# Patient Record
Sex: Female | Born: 1975 | Race: White | Hispanic: No | Marital: Married | Smoking: Never smoker
Health system: Southern US, Community
[De-identification: ages and names within clinical notes are randomized; demographics above are authoritative.]

## PROBLEM LIST (undated history)

## (undated) DIAGNOSIS — R7611 Nonspecific reaction to tuberculin skin test without active tuberculosis: Secondary | ICD-10-CM

## (undated) DIAGNOSIS — I10 Essential (primary) hypertension: Secondary | ICD-10-CM

## (undated) DIAGNOSIS — R112 Nausea with vomiting, unspecified: Secondary | ICD-10-CM

## (undated) DIAGNOSIS — T8859XA Other complications of anesthesia, initial encounter: Secondary | ICD-10-CM

## (undated) DIAGNOSIS — K219 Gastro-esophageal reflux disease without esophagitis: Secondary | ICD-10-CM

## (undated) DIAGNOSIS — T4145XA Adverse effect of unspecified anesthetic, initial encounter: Secondary | ICD-10-CM

## (undated) DIAGNOSIS — E669 Obesity, unspecified: Secondary | ICD-10-CM

## (undated) DIAGNOSIS — R002 Palpitations: Secondary | ICD-10-CM

## (undated) DIAGNOSIS — R519 Headache, unspecified: Secondary | ICD-10-CM

## (undated) DIAGNOSIS — E282 Polycystic ovarian syndrome: Secondary | ICD-10-CM

## (undated) DIAGNOSIS — D649 Anemia, unspecified: Secondary | ICD-10-CM

## (undated) DIAGNOSIS — L509 Urticaria, unspecified: Secondary | ICD-10-CM

## (undated) DIAGNOSIS — Z9889 Other specified postprocedural states: Secondary | ICD-10-CM

## (undated) DIAGNOSIS — R51 Headache: Secondary | ICD-10-CM

## (undated) DIAGNOSIS — M549 Dorsalgia, unspecified: Secondary | ICD-10-CM

## (undated) HISTORY — DX: Urticaria, unspecified: L50.9

## (undated) HISTORY — PX: WISDOM TOOTH EXTRACTION: SHX21

## (undated) HISTORY — DX: Obesity, unspecified: E66.9

---

## 2011-06-29 ENCOUNTER — Ambulatory Visit
Admission: RE | Admit: 2011-06-29 | Discharge: 2011-06-29 | Disposition: A | Discharge status: Home / Self Care | Payer: PRIVATE HEALTH INSURANCE | Source: Ambulatory Visit | Attending: Infectious Diseases | Admitting: Infectious Diseases

## 2011-06-29 ENCOUNTER — Other Ambulatory Visit: Payer: Self-pay | Admitting: Infectious Diseases

## 2011-06-29 DIAGNOSIS — R7611 Nonspecific reaction to tuberculin skin test without active tuberculosis: Secondary | ICD-10-CM

## 2011-10-11 ENCOUNTER — Encounter: Payer: 59 | Attending: Family Medicine | Admitting: *Deleted

## 2011-10-12 ENCOUNTER — Encounter: Payer: Self-pay | Admitting: *Deleted

## 2011-10-12 NOTE — Patient Instructions (Signed)
Patient will attend Core Diabetes Courses II and III as scheduled or follow up prn.  

## 2011-10-12 NOTE — Progress Notes (Signed)
No results found for this basename: HGBA1C   Most recent A1C per referring agent = 6.1%

## 2011-10-12 NOTE — Progress Notes (Signed)
  Patient was seen on 10/11/11 for the first of a series of three diabetes self-management courses at the [facility]. The following learning objectives were met by the patient during this course:   Defines the role of glucose and insulin  Identifies type of diabetes and pathophysiology  Defines the diagnostic criteria for diabetes and prediabetes  States the risk factors for Type 2 Diabetes  States the symptoms of Type 2 Diabetes  Defines Type 2 Diabetes treatment goals  Defines Type 2 Diabetes treatment options  States the rationale for glucose monitoring  Identifies A1C, glucose targets, and testing times  Identifies proper sharps disposal  Defines the purpose of a diabetes food plan  Identifies carbohydrate food groups  Defines effects of carbohydrate foods on glucose levels  Identifies carbohydrate choices/grams/food labels  States benefits of physical activity and effect on glucose  Review of suggested activity guidelines  Handouts given during class include:  Type 2 Diabetes: Basics Book  My Food Plan Book  Food and Activity Log  Patient has established the following initial goals:  Increase exercise  Work on stress levels  Follow DM Meal Plan  Lose Weight  Follow-Up Plan: Pt to attend Core Diabetes Education Class Series @ [facility]

## 2011-10-19 ENCOUNTER — Other Ambulatory Visit (HOSPITAL_COMMUNITY): Payer: Self-pay | Admitting: *Deleted

## 2011-10-19 DIAGNOSIS — Q519 Congenital malformation of uterus and cervix, unspecified: Secondary | ICD-10-CM

## 2011-10-21 ENCOUNTER — Ambulatory Visit (HOSPITAL_COMMUNITY)
Admission: RE | Admit: 2011-10-21 | Discharge: 2011-10-21 | Disposition: A | Discharge status: Home / Self Care | Payer: 59 | Source: Ambulatory Visit | Attending: Family Medicine | Admitting: Family Medicine

## 2011-10-21 DIAGNOSIS — Q519 Congenital malformation of uterus and cervix, unspecified: Secondary | ICD-10-CM

## 2011-11-16 ENCOUNTER — Ambulatory Visit: Payer: 59

## 2011-11-23 ENCOUNTER — Ambulatory Visit: Payer: 59

## 2011-12-14 ENCOUNTER — Encounter: Payer: 59 | Attending: Family Medicine | Admitting: Dietician

## 2011-12-15 NOTE — Progress Notes (Signed)
  Patient was seen on 12/14/2011 for the second of a series of three diabetes self-management courses at the [facility]. The following learning objectives were met by the patient during this course:   Explain basic nutrition maintenance and quality assurance  Describe causes, symptoms and treatment of hypoglycemia and hyperglycemia  Explain how to manage diabetes during illness  Describe the importance of good nutrition for health and healthy eating strategies  List strategies to follow meal plan when dining out  Describe the effects of alcohol on glucose and how to use it safely  Describe problem solving skills for day-to-day glucose challenges  Describe strategies to use when treatment plan needs to change  Identify important factors involved in successful weight loss  Describe ways to remain physically active  Describe the impact of regular activity on insulin resistance  Patient updated their initials goals from Core Class I to include:Agreed to contemplate her goals and finalize at Core Class 3.  ReliOnGlucose tablets given in class (1 packet)  Discussed the use of glucose tablets, but did not distribute a sample.  Handouts given in class:  Refrigerator magnet for Sick Day Guidelines  [facility] Oral medication/insulin handout  Follow-Up Plan: Patient will attend the final class of the ADA Diabetes Self-Care Education.

## 2011-12-21 ENCOUNTER — Encounter: Payer: 59 | Admitting: Dietician

## 2011-12-22 NOTE — Progress Notes (Signed)
  Patient was seen on 12/21/2011 for the third of a series of three diabetes self-management courses at the [facility]. The following learning objectives were met by the patient during this course:    Describe how diabetes changes over time   Identify diabetes complications and ways to prevent them   Describe strategies that can promote heart health including lowering blood pressure and cholesterol   Describe strategies to lower dietary fat and sodium in the diet   Identify physical activities that benefit cardiovascular health   Evaluate success in meeting personal goal   Describe the belief that they can live successfully with diabetes day to day   Establish 2-3 goals that they will plan to diligently work on until they return for the free 23-month follow-up visit  The following handouts were given in class:  3 Month Follow Up Visit handout  Goal setting handout  Class evaluation form  Your patient has established the following 3 month goals for diabetes self-care:  Eat less fast food and eating out   Be active 30 minutes or more 3 times per week.  Follow-Up Plan: Patient will attend a 3 month follow-up visit for diabetes self-management education.

## 2012-03-28 ENCOUNTER — Ambulatory Visit: Payer: 59 | Admitting: Dietician

## 2012-03-30 ENCOUNTER — Encounter: Payer: Self-pay | Admitting: Dietician

## 2012-03-30 ENCOUNTER — Encounter: Payer: 59 | Attending: Family Medicine | Admitting: Dietician

## 2012-03-30 DIAGNOSIS — Z713 Dietary counseling and surveillance: Secondary | ICD-10-CM | POA: Insufficient documentation

## 2012-03-30 DIAGNOSIS — E119 Type 2 diabetes mellitus without complications: Secondary | ICD-10-CM | POA: Insufficient documentation

## 2012-03-30 NOTE — Progress Notes (Signed)
  Patient was seen on 03/30/2012 for their 3 month follow-up as a part of the diabetes self-management courses at the [facility]. The following learning objectives were met by your patient during this course:  Patient self reports the following:  Diabetes control has improved since diabetes self-management training: yes Number of days blood glucose is >200: none Last MD appointment for diabetes: March 23, 2012 Changes in treatment plan: Metformin decreased to 500 mg two times daily Confidence with ability to manage diabetes: Feels more confident Areas for improvement with diabetes self-care: Wants to lose more weight, to lose to under 200 lbs.  Wants to continue to exercise and be able to run a 5k race. 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  Please see Diabetes Flow sheet for findings related to patient's self-care.  Follow-Up Plan: Patient is eligible for a "free" 30 minute diabetes self-care appointment in the next year. Patient to call and schedule as needed.

## 2012-11-30 ENCOUNTER — Ambulatory Visit (INDEPENDENT_AMBULATORY_CARE_PROVIDER_SITE_OTHER): Payer: Self-pay | Admitting: Family Medicine

## 2012-11-30 DIAGNOSIS — E119 Type 2 diabetes mellitus without complications: Secondary | ICD-10-CM

## 2012-11-30 NOTE — Progress Notes (Signed)
Patient presents for 3 month follow of DM as part of the Link to [employer] employee sponsored program. Medications and glucose readings have been reviewed. I have also discussed with patient lifestyle interventions such as diet and exercise. Full documentation of this visit can be found in the [employer] documenting system through [employer] [facility]). Patient has set a series of personal goals and will follow up in 3 months for further review of DM.

## 2012-12-25 NOTE — Progress Notes (Signed)
Patient ID: Angelica Carroll, female   DOB: 10/28/1975, 37 y.o.   MRN: [id number] ATTENDING PHYSICIAN NOTE: I have reviewed the chart and agree with the plan as detailed above. Denny Levy MD Pager [phone]

## 2013-04-12 ENCOUNTER — Ambulatory Visit (INDEPENDENT_AMBULATORY_CARE_PROVIDER_SITE_OTHER): Payer: Self-pay | Admitting: Family Medicine

## 2013-04-12 DIAGNOSIS — E119 Type 2 diabetes mellitus without complications: Secondary | ICD-10-CM

## 2013-04-12 NOTE — Progress Notes (Signed)
Patient presents for 3 month follow up DM as part of the employee sponsored Link to [employer]. Medications and glucose readings have been reviewed. I have also discussed with patient lifestyle interventions such as diet and exercise. Full documentation of this note can be found in the [employer] documenting system through [employer] [facility]). Patient has set a series of personal goals and will f/u in 3 months for further review of DM.

## 2013-04-24 NOTE — Progress Notes (Signed)
Patient ID: Angelica Carroll, female   DOB: 27-Jul-1976, 36 y.o.   MRN: [id number] ATTENDING PHYSICIAN NOTE: I have reviewed the chart and agree with the plan as detailed above. Denny Levy MD Pager [phone]

## 2013-06-19 ENCOUNTER — Encounter (HOSPITAL_COMMUNITY): Payer: Self-pay | Admitting: *Deleted

## 2013-06-19 ENCOUNTER — Emergency Department (HOSPITAL_COMMUNITY)
Admission: EM | Admit: 2013-06-19 | Discharge: 2013-06-19 | Disposition: A | Discharge status: Home / Self Care | Payer: 59 | Source: Home / Self Care | Attending: Emergency Medicine | Admitting: Emergency Medicine

## 2013-06-19 DIAGNOSIS — J029 Acute pharyngitis, unspecified: Secondary | ICD-10-CM

## 2013-06-19 MED ORDER — HYDROCOD POLST-CHLORPHEN POLST 10-8 MG/5ML PO LQCR: 5.0000 mL | Freq: Two times a day (BID) | ORAL | Status: DC | PRN

## 2013-06-19 MED ORDER — PREDNISONE 20 MG PO TABS: 20.0000 mg | ORAL_TABLET | Freq: Two times a day (BID) | ORAL | Status: DC

## 2013-06-19 NOTE — ED Notes (Signed)
Pt  Reports  Symptoms  Of  sorethroat   X  sev  Days  Worse  Yesterday    -  Hurts  To  Swallow  And  Has  Bilateral  Earache  As  Well             Pt  Reports    Some  Congestion and  For the  Most part  A  Non  Productive  Cough

## 2013-06-19 NOTE — ED Provider Notes (Signed)
Chief Complaint:   Chief Complaint  Patient presents with  . Sore Throat    History of Present Illness:   Angelica Carroll is a 37 year old female who has had a three-day history of a stroke, hoarseness, cough productive of small amounts of yellow sputum, chest tightness, chest pain, nasal congestion with clear drainage, sinus pressure, headache, watery eyes, and ear congestion. She was exposed to a viral illness. She denies any fever, chills, or GI symptoms.  Review of Systems:  Other than noted above, the patient denies any of the following symptoms: Systemic:  No fevers, chills, sweats, weight loss or gain, fatigue, or tiredness. Eye:  No redness or discharge. ENT:  No ear pain, drainage, headache, nasal congestion, drainage, sinus pressure, difficulty swallowing, or sore throat. Neck:  No neck pain or swollen glands. Lungs:  No cough, sputum production, hemoptysis, wheezing, chest tightness, shortness of breath or chest pain. GI:  No abdominal pain, nausea, vomiting or diarrhea.  PMFSH:  Past medical history, family history, social history, meds, and allergies were reviewed. She has diabetes and takes metformin and Protonix.  Physical Exam:   Vital signs:  BP 142/93  Pulse 82  Temp(Src) 99.5 F (37.5 C) (Oral)  Resp 16  SpO2 98% General:  Alert and oriented.  In no distress.  Skin warm and dry. Eye:  No conjunctival injection or drainage. Lids were normal. ENT:  TMs and canals were normal, without erythema or inflammation.  Nasal mucosa was clear and uncongested, without drainage.  Mucous membranes were moist.  Pharynx was clear with no exudate or drainage.  There were no oral ulcerations or lesions. Neck:  Supple, no adenopathy, tenderness or mass. Lungs:  No respiratory distress.  Lungs were clear to auscultation, without wheezes, rales or rhonchi.  Breath sounds were clear and equal bilaterally.  Heart:  Regular rhythm, without gallops, murmers or rubs. Skin:  Clear, warm, and dry,  without rash or lesions.  Labs:   Results for orders placed during the hospital encounter of 06/19/13  POCT RAPID STREP A ([facility] CARE ONLY)      Result Value Range   Streptococcus, Group A Screen (Direct) NEGATIVE  NEGATIVE     Assessment:  The encounter diagnosis was Viral pharyngitis.  Plan:   1.  The following meds were prescribed:   Discharge Medication List as of 06/19/2013  1:30 PM    START taking these medications   Details  chlorpheniramine-HYDROcodone (TUSSIONEX) 10-8 MG/5ML LQCR Take 5 mLs by mouth every 12 (twelve) hours as needed., Starting 06/19/2013, Until Discontinued, Normal    predniSONE (DELTASONE) 20 MG tablet Take 1 tablet (20 mg total) by mouth 2 (two) times daily., Starting 06/19/2013, Until Discontinued, Normal       2.  The patient was instructed in symptomatic care and handouts were given. 3.  The patient was told to return if becoming worse in any way, if no better in 3 or 4 days, and given some red flag symptoms such as fever or difficulty breathing that would indicate earlier return. 4.  Follow up here if necessary.      Reuben Likes, MD 06/19/13 [id number]

## 2013-06-21 ENCOUNTER — Telehealth (HOSPITAL_COMMUNITY): Payer: Self-pay | Admitting: *Deleted

## 2013-06-21 ENCOUNTER — Telehealth (HOSPITAL_COMMUNITY): Payer: Self-pay | Admitting: Emergency Medicine

## 2013-06-21 LAB — CULTURE, GROUP A STREP

## 2013-06-21 MED ORDER — AZITHROMYCIN 500 MG PO TABS: 500.0000 mg | ORAL_TABLET | Freq: Every day | ORAL | Status: DC

## 2013-06-21 NOTE — ED Notes (Signed)
Throat culture: Strep beta hemolytic not group A.  Message sent to Dr. Lorenz Coaster. Vassie Moselle 06/21/2013

## 2013-06-21 NOTE — ED Notes (Addendum)
Strep culture was positive for beta-hemolytic strep. She was not treated for streptococcus. We'll send in a prescription for penicillin V 500 mg 3 times a day for 10 days.  Reuben Likes, MD 06/21/13 1930  The patient is allergic to amoxicillin, so will treat with azithromycin 500 mg once a day for 5 days.  Reuben Likes, MD 06/21/13 [phone]

## 2013-06-21 NOTE — ED Notes (Signed)
Dr. Lorenz Coaster e-prescribed Zithromax to the [facility].  I called pt.  Pt. verified x 2 and given results. Pt. told she needs Zithromax for the strep throat.  Pt. told where to pick up her Rx.  Instructed pt. if anyone she exposed prior to treatment gets the same symptoms, that they should be checked for strep also.  Pt. voiced understanding. Vassie Moselle 06/21/2013

## 2013-06-21 NOTE — Telephone Encounter (Signed)
Message copied by Reuben Likes on Thu Jun 21, 2013  7:32 PM ------      Message from: Vassie Moselle      Created: Thu Jun 21, 2013  7:26 PM      Regarding: throat cx.       Strep beta hemolytic not group A. I did not see any PCN ordered.      Vassie Moselle      06/21/2013       ------

## 2013-06-21 NOTE — Telephone Encounter (Signed)
Message copied by Reuben Likes on Thu Jun 21, 2013  7:29 PM ------      Message from: Vassie Moselle      Created: Thu Jun 21, 2013  7:26 PM      Regarding: throat cx.       Strep beta hemolytic not group A. I did not see any PCN ordered.      Vassie Moselle      06/21/2013       ------

## 2013-08-02 ENCOUNTER — Ambulatory Visit (INDEPENDENT_AMBULATORY_CARE_PROVIDER_SITE_OTHER): Payer: 59 | Admitting: Family Medicine

## 2013-08-02 VITALS — BP 142/88 | HR 71

## 2013-08-02 DIAGNOSIS — E119 Type 2 diabetes mellitus without complications: Secondary | ICD-10-CM

## 2013-08-02 NOTE — Progress Notes (Signed)
Patient presents for 3 mo f/u DM as part of the employee sponsored Link to [employer]. Medications and glucometer have been reviewed. I have also discussed with patient lifestyle interventions such as diet and exercise. Full documentation of this visit can be found in the [employer] documenting system through [employer] [facility]). However specifics from this visit include the following:  Diabetes Mellitus: POC A1C 5.7 at goal. No recommended changes. Patient was counseled to log onto [email or web address] and claim her tobacco free badge and additional badges to earn pay out. Will work on [employer] and diet.  Hyperlipidemia:no statin. Patient doesn't have a primary care physician anymore. Told patient she needs to make an appointment to see a new primary but she is not 38 yet so she doesn't fall in that statin category for of diabetics btwn 40-75. However still need to check levels and make sure nonHDL is at goal and go from there. HTN: systolic a little elevated. Told patient to monitor at home and continues above 140 to make an appt.   Patient has set a series of personal goals and will f/u in 3 mo for further review of DM.

## 2013-09-06 ENCOUNTER — Other Ambulatory Visit: Payer: Self-pay | Admitting: *Deleted

## 2013-09-06 DIAGNOSIS — R002 Palpitations: Secondary | ICD-10-CM

## 2013-09-18 ENCOUNTER — Encounter: Payer: Self-pay | Admitting: *Deleted

## 2013-09-18 ENCOUNTER — Encounter (INDEPENDENT_AMBULATORY_CARE_PROVIDER_SITE_OTHER): Payer: 59

## 2013-09-18 DIAGNOSIS — R002 Palpitations: Secondary | ICD-10-CM

## 2013-09-18 NOTE — Progress Notes (Signed)
Patient ID: Angelica Carroll, female   DOB: [birth year], 37 y.o.   MRN: [id number] E-Cardio 48 hour holter monitor applied to patient.

## 2013-10-15 NOTE — Progress Notes (Signed)
Patient ID: Angelica FesterSara Rickett, female   DOB: 10/02/1976, 38 y.o.   MRN: [id number] ATTENDING PHYSICIAN NOTE: I have reviewed the chart and agree with the plan as detailed above. Denny LevySara Torrez Renfroe MD Pager [phone]

## 2013-12-03 ENCOUNTER — Ambulatory Visit: Payer: PRIVATE HEALTH INSURANCE | Attending: Family Medicine

## 2013-12-03 DIAGNOSIS — M6281 Muscle weakness (generalized): Secondary | ICD-10-CM | POA: Insufficient documentation

## 2013-12-03 DIAGNOSIS — M545 Low back pain, unspecified: Secondary | ICD-10-CM | POA: Insufficient documentation

## 2013-12-03 DIAGNOSIS — IMO0001 Reserved for inherently not codable concepts without codable children: Secondary | ICD-10-CM | POA: Insufficient documentation

## 2013-12-03 DIAGNOSIS — M25559 Pain in unspecified hip: Secondary | ICD-10-CM | POA: Insufficient documentation

## 2013-12-04 ENCOUNTER — Ambulatory Visit: Payer: PRIVATE HEALTH INSURANCE | Admitting: Rehabilitation

## 2013-12-11 ENCOUNTER — Ambulatory Visit: Payer: PRIVATE HEALTH INSURANCE | Admitting: Rehabilitation

## 2013-12-13 ENCOUNTER — Ambulatory Visit: Payer: PRIVATE HEALTH INSURANCE | Admitting: Physical Therapy

## 2013-12-19 ENCOUNTER — Ambulatory Visit: Payer: PRIVATE HEALTH INSURANCE | Admitting: Rehabilitation

## 2013-12-20 ENCOUNTER — Ambulatory Visit: Payer: PRIVATE HEALTH INSURANCE | Admitting: Rehabilitation

## 2013-12-24 ENCOUNTER — Ambulatory Visit: Payer: PRIVATE HEALTH INSURANCE | Admitting: Physical Therapy

## 2014-01-03 ENCOUNTER — Ambulatory Visit (INDEPENDENT_AMBULATORY_CARE_PROVIDER_SITE_OTHER): Payer: Self-pay | Admitting: Family Medicine

## 2014-01-03 VITALS — BP 145/95 | HR 74 | Wt 230.0 lb

## 2014-01-03 DIAGNOSIS — E119 Type 2 diabetes mellitus without complications: Secondary | ICD-10-CM

## 2014-01-08 NOTE — Progress Notes (Signed)
Patient presents for 3 mo f/u DM as part of the employee sponsored Link to [employer]. Medications have been reviewed. I have also discussed with patient lifestyle interventions such as diet and exercise. Full documentation of this visit can be found in the [employer] documenting system through [facility] However specifics from this visit include the following:  Diabetes Mellitus: Other POC A1C 5.5, at goal. No recommended changes. Patient had a back injury at work and cannot exercise at the moment.  plan 1.) continue metformin 2.) will move /fu to 6 months since her blood sugar is very stable.  Hyperlipidemia: not on a statin now but will need to be when she turns 38 this fall.  Hypertension: eleveated today. Patient has been on HCTZ in the past but was taken off. She is in a lot of pain from her back injury. Told her to monitor at work and to give MD call if stays elevated. i will caller in a couple weeks to see if it is coming down.    Patient has set a series of personal goals and will f/u in 3 mo for further review of dm

## 2014-01-17 ENCOUNTER — Other Ambulatory Visit (HOSPITAL_COMMUNITY): Payer: Self-pay | Admitting: Physical Medicine and Rehabilitation

## 2014-01-17 DIAGNOSIS — M5136 Other intervertebral disc degeneration, lumbar region: Secondary | ICD-10-CM

## 2014-01-22 ENCOUNTER — Ambulatory Visit (HOSPITAL_COMMUNITY)
Admission: RE | Admit: 2014-01-22 | Discharge: 2014-01-22 | Disposition: A | Discharge status: Home / Self Care | Payer: PRIVATE HEALTH INSURANCE | Source: Ambulatory Visit | Attending: Physical Medicine and Rehabilitation | Admitting: Physical Medicine and Rehabilitation

## 2014-01-22 DIAGNOSIS — M5126 Other intervertebral disc displacement, lumbar region: Secondary | ICD-10-CM | POA: Insufficient documentation

## 2014-01-22 DIAGNOSIS — R209 Unspecified disturbances of skin sensation: Secondary | ICD-10-CM | POA: Insufficient documentation

## 2014-01-22 DIAGNOSIS — M545 Low back pain, unspecified: Secondary | ICD-10-CM | POA: Insufficient documentation

## 2014-01-22 DIAGNOSIS — M5136 Other intervertebral disc degeneration, lumbar region: Secondary | ICD-10-CM

## 2014-04-25 NOTE — Progress Notes (Signed)
Patient ID: Verlin FesterSara Mcphie, female   DOB: 06/05/1976, 38 y.o.   MRN: [id number] ATTENDING PHYSICIAN NOTE: I have reviewed the chart and agree with the plan as detailed above. Denny LevySara Mellisa Arshad MD Pager [phone]

## 2014-06-23 ENCOUNTER — Encounter (HOSPITAL_COMMUNITY): Payer: Self-pay | Admitting: Emergency Medicine

## 2014-06-23 ENCOUNTER — Emergency Department (INDEPENDENT_AMBULATORY_CARE_PROVIDER_SITE_OTHER)
Admission: EM | Admit: 2014-06-23 | Discharge: 2014-06-23 | Disposition: A | Discharge status: Home / Self Care | Payer: PRIVATE HEALTH INSURANCE | Source: Home / Self Care | Attending: Family Medicine | Admitting: Family Medicine

## 2014-06-23 DIAGNOSIS — M5430 Sciatica, unspecified side: Secondary | ICD-10-CM | POA: Diagnosis not present

## 2014-06-23 DIAGNOSIS — M5441 Lumbago with sciatica, right side: Secondary | ICD-10-CM

## 2014-06-23 MED ORDER — PREDNISONE 10 MG PO KIT: PACK | ORAL | Status: DC

## 2014-06-23 MED ORDER — HYDROCODONE-ACETAMINOPHEN 5-325 MG PO TABS: 1.0000 | ORAL_TABLET | Freq: Four times a day (QID) | ORAL | Status: DC | PRN

## 2014-06-23 NOTE — Discharge Instructions (Signed)
Thank you for coming in today. °Come back or go to the emergency room if you notice new weakness new numbness problems walking or bowel or bladder problems. ° °Sciatica °Sciatica is pain, weakness, numbness, or tingling along the path of the sciatic nerve. The nerve starts in the lower back and runs down the back of each leg. The nerve controls the muscles in the lower leg and in the back of the knee, while also providing sensation to the back of the thigh, lower leg, and the sole of your foot. Sciatica is a symptom of another medical condition. For instance, nerve damage or certain conditions, such as a herniated disk or bone spur on the spine, pinch or put pressure on the sciatic nerve. This causes the pain, weakness, or other sensations normally associated with sciatica. Generally, sciatica only affects one side of the body. °CAUSES  °· Herniated or slipped disc. °· Degenerative disk disease. °· A pain disorder involving the narrow muscle in the buttocks (piriformis syndrome). °· Pelvic injury or fracture. °· Pregnancy. °· Tumor (rare). °SYMPTOMS  °Symptoms can vary from mild to very severe. The symptoms usually travel from the low back to the buttocks and down the back of the leg. Symptoms can include: °· Mild tingling or dull aches in the lower back, leg, or hip. °· Numbness in the back of the calf or sole of the foot. °· Burning sensations in the lower back, leg, or hip. °· Sharp pains in the lower back, leg, or hip. °· Leg weakness. °· Severe back pain inhibiting movement. °These symptoms may get worse with coughing, sneezing, laughing, or prolonged sitting or standing. Also, being overweight may worsen symptoms. °DIAGNOSIS  °Your caregiver will perform a physical exam to look for common symptoms of sciatica. He or she may ask you to do certain movements or activities that would trigger sciatic nerve pain. Other tests may be performed to find the cause of the sciatica. These may include: °· Blood  tests. °· X-rays. °· Imaging tests, such as an MRI or CT scan. °TREATMENT  °Treatment is directed at the cause of the sciatic pain. Sometimes, treatment is not necessary and the pain and discomfort goes away on its own. If treatment is needed, your caregiver may suggest: °· Over-the-counter medicines to relieve pain. °· Prescription medicines, such as anti-inflammatory medicine, muscle relaxants, or narcotics. °· Applying heat or ice to the painful area. °· Steroid injections to lessen pain, irritation, and inflammation around the nerve. °· Reducing activity during periods of pain. °· Exercising and stretching to strengthen your abdomen and improve flexibility of your spine. Your caregiver may suggest losing weight if the extra weight makes the back pain worse. °· Physical therapy. °· Surgery to eliminate what is pressing or pinching the nerve, such as a bone spur or part of a herniated disk. °HOME CARE INSTRUCTIONS  °· Only take over-the-counter or prescription medicines for pain or discomfort as directed by your caregiver. °· Apply ice to the affected area for 20 minutes, 3-4 times a day for the first 48-72 hours. Then try heat in the same way. °· Exercise, stretch, or perform your usual activities if these do not aggravate your pain. °· Attend physical therapy sessions as directed by your caregiver. °· Keep all follow-up appointments as directed by your caregiver. °· Do not wear high heels or shoes that do not provide proper support. °· Check your mattress to see if it is too soft. A firm mattress may lessen your pain   and discomfort. °SEEK IMMEDIATE MEDICAL CARE IF:  °· You lose control of your bowel or bladder (incontinence). °· You have increasing weakness in the lower back, pelvis, buttocks, or legs. °· You have redness or swelling of your back. °· You have a burning sensation when you urinate. °· You have pain that gets worse when you lie down or awakens you at night. °· Your pain is worse than you have  experienced in the past. °· Your pain is lasting longer than 4 weeks. °· You are suddenly losing weight without reason. °MAKE SURE YOU: °· Understand these instructions. °· Will watch your condition. °· Will get help right away if you are not doing well or get worse. °Document Released: 09/14/2001 Document Revised: 03/21/2012 Document Reviewed: 01/30/2012 °ExitCare® Patient Information ©2015 [employer]. This information is not intended to replace advice given to you by your health care provider. Make sure you discuss any questions you have with your health care provider. ° °

## 2014-06-23 NOTE — ED Notes (Signed)
Back problems; reported bulging discs

## 2014-06-23 NOTE — ED Provider Notes (Signed)
Angelica Carroll is a 39 y.o. female who presents to Urgent Care today for back pain. Patient has a 7 month history of low back pain occurring after a working injury. She has the pain has been off-and-on however yesterday the pain worsened. She notes severe low back pain radiating to her right leg. She denies any weakness or numbness. No fevers or chills. No nausea vomiting or diarrhea. No bowel bladder dysfunction or difficulty walking. She's under the care of Dr. Nelva Bush at [place].   Past Medical History  Diagnosis Date  . Diabetes mellitus   . Obese    History  Substance Use Topics  . Smoking status: Never Smoker   . Smokeless tobacco: Never Used  . Alcohol Use: No   ROS as above Medications: No current facility-administered medications for this encounter.   Current Outpatient Prescriptions  Medication Sig Dispense Refill  . cetirizine (ZYRTEC) 10 MG tablet Take 10 mg by mouth daily.      . cholecalciferol (VITAMIN D) 1000 UNITS tablet Take 1,000 Units by mouth daily.       Marland Kitchen HYDROcodone-acetaminophen (NORCO/VICODIN) 5-325 MG per tablet Take 1-2 tablets by mouth every 6 (six) hours as needed.  15 tablet  0  . metFORMIN (GLUCOPHAGE) 1000 MG tablet Take 500 mg by mouth 2 (two) times daily with a meal.       . Multiple Vitamins-Iron (MULTIVITAMIN/IRON PO) Take 1 tablet by mouth daily.      . pantoprazole (PROTONIX) 40 MG tablet Take 40 mg by mouth daily.      . PredniSONE 10 MG KIT 12 day dose pack po  1 kit  0  . sodium fluoride (SF 5000 PLUS) 1.1 % CREA dental cream Place 1 application onto teeth at bedtime. Brush and then spit out      . tranexamic acid (LYSTEDA) 650 MG TABS Take 1,300 mg by mouth 3 (three) times daily. For 5 days during menstration        Exam:  BP 158/96  Pulse 88  Temp(Src) 98.1 F (36.7 C) (Oral)  Resp 16  SpO2 98% Gen: Well NAD HEENT: EOMI,  MMM Lungs: Normal work of breathing. CTABL Heart: RRR no MRG Abd: NABS, Soft. Nondistended,  Nontender Exts: Brisk capillary refill, warm and well perfused.  Back: Nontender to spinal midline. Neck range of motion significantly limited in flexion due to pain. Negative straight leg raise test and Faber test bilaterally. Reflexes and strength are intact and equal bilateral lower extremities. Antalgic gait. Incision is intact throughout.  No results found for this or any previous visit (from the past 24 hour(s)). No results found.  Assessment and Plan: 38 y.o. female with lumbago with sciatica. Plan to treat with prednisone and Norco. Followup with orthopedics.  Discussed warning signs or symptoms. Please see discharge instructions. Patient expresses understanding.     Gregor Hams, MD 06/23/14 [phone]

## 2014-07-16 ENCOUNTER — Ambulatory Visit (INDEPENDENT_AMBULATORY_CARE_PROVIDER_SITE_OTHER): Payer: Self-pay | Admitting: Family Medicine

## 2014-07-16 VITALS — BP 159/109 | HR 74 | Wt 225.0 lb

## 2014-07-16 DIAGNOSIS — E119 Type 2 diabetes mellitus without complications: Secondary | ICD-10-CM | POA: Insufficient documentation

## 2014-07-16 NOTE — Progress Notes (Signed)
Patient presents for 3 mo f/u DM as part of the employee sponsored [employer]. Medications have been reviewed. I have also discussed with patient lifestyle interventions such as diet and exercise. Full documentation of this visit can be found in the [employer] documenting system through [facility] However specifics from this visit and areas of concern include the following:  Diabetes Mellitus:  POC A1C 6.6, jumped from 5.5 but patient has had several rounds of steroid injections and oral prednisone for a back injury. Still at goal, no recommended medications changes. plan 1.) goal weight <200 lb by spring. Patient will track calories on live life well [place] page 2.) needs a flu shot and PNA shot. PCP apt in November, will remind doctor she needs PNA shot. 3.) filled test strips today 4.) f/u 3 mo  Hyperlipidemia:patient is turning 38 next month. Cholesterol guidelines recommend all diabetics btwn 40-75 be on moderate intensity statin. Will recommend to MD to begin statin.  Hypertension: BP elevated today. She used to be on HCTZ but was taken off. Her BP was elevated last time I checked it as well. Told patient to keep BP log and take to MD apt next month. Patient is in pain with back injury which may contribute but recommended she keep a log to take to md. I will fax md to let her know it's been high the last two times I've checked it and that patient will be keeping log. Counseled on goal <140/90.   Patient has set a series of personal goals and will f/u in 3 mo for further review of DM

## 2014-07-31 ENCOUNTER — Encounter (HOSPITAL_COMMUNITY): Payer: Self-pay | Admitting: Emergency Medicine

## 2014-07-31 ENCOUNTER — Emergency Department (HOSPITAL_COMMUNITY)
Admission: EM | Admit: 2014-07-31 | Discharge: 2014-07-31 | Disposition: A | Discharge status: Home / Self Care | Payer: 59 | Source: Home / Self Care | Attending: Family Medicine | Admitting: Family Medicine

## 2014-07-31 DIAGNOSIS — G43009 Migraine without aura, not intractable, without status migrainosus: Secondary | ICD-10-CM

## 2014-07-31 MED ORDER — METOCLOPRAMIDE HCL 5 MG/ML IJ SOLN
10.0000 mg | Freq: Once | INTRAMUSCULAR | Status: AC
  Administered 2014-07-31: 10 mg via INTRAMUSCULAR

## 2014-07-31 MED ORDER — DIPHENHYDRAMINE HCL 50 MG/ML IJ SOLN
INTRAMUSCULAR | Status: AC
  Filled 2014-07-31: qty 1

## 2014-07-31 MED ORDER — KETOROLAC TROMETHAMINE 60 MG/2ML IM SOLN
INTRAMUSCULAR | Status: AC
  Filled 2014-07-31: qty 2

## 2014-07-31 MED ORDER — BUTALBITAL-APAP-CAFFEINE 50-325-40 MG PO TABS: 1.0000 | ORAL_TABLET | ORAL | Status: DC | PRN

## 2014-07-31 MED ORDER — KETOROLAC TROMETHAMINE 60 MG/2ML IM SOLN
60.0000 mg | Freq: Once | INTRAMUSCULAR | Status: AC
  Administered 2014-07-31: 60 mg via INTRAMUSCULAR

## 2014-07-31 MED ORDER — DIPHENHYDRAMINE HCL 50 MG/ML IJ SOLN
25.0000 mg | Freq: Once | INTRAMUSCULAR | Status: AC
  Administered 2014-07-31: 25 mg via INTRAMUSCULAR

## 2014-07-31 MED ORDER — METOCLOPRAMIDE HCL 5 MG/ML IJ SOLN
INTRAMUSCULAR | Status: AC
  Filled 2014-07-31: qty 2

## 2014-07-31 NOTE — ED Provider Notes (Signed)
CSN: [id number]     Arrival date & time 07/31/14  1707 History   First MD Initiated Contact with Patient 07/31/14 1759     Chief Complaint  Patient presents with  . Migraine   (Consider location/radiation/quality/duration/timing/severity/associated sxs/prior Treatment) HPI      38 year old female with history of mild untreated hypertension, diabetes, presents complaining of headache. Starting at noon today she workup with a headache in the back of her head that was approximately 6-7 out of 10 in severity. She tried to go back to sleep, when she woke up again the headache was left parietal and about 9 out of 10 in severity. She is also experiencing nausea without vomiting and phonophobia. She has tried drinking water and taking tramadol, and drinking caffeine to help the headache which has not helped. She denies vomiting, photophobia, fever, chills, neck stiffness. She has no previous history of migraines. She does not take any blood thinners. She denies any injury, trauma, or falls. No blurry or double vision. No weakness. No recent travel or sick contacts. No history of ICH. She says that she woke up with the headache, the headache did not wake her up from sleep.     Past Medical History  Diagnosis Date  . Diabetes mellitus   . Obese    History reviewed. No pertinent past surgical history. History reviewed. No pertinent family history. History  Substance Use Topics  . Smoking status: Never Smoker   . Smokeless tobacco: Never Used  . Alcohol Use: No   OB History   Grav Para Term Preterm Abortions TAB SAB Ect Mult Living                 Review of Systems  Constitutional: Negative for fever and chills.  Eyes: Negative for visual disturbance.  Respiratory: Negative for cough and shortness of breath.   Cardiovascular: Negative for chest pain, palpitations and leg swelling.  Gastrointestinal: Positive for nausea. Negative for vomiting and abdominal pain.  Endocrine: Negative for  polydipsia and polyuria.  Genitourinary: Negative for dysuria, urgency and frequency.  Musculoskeletal: Negative for arthralgias and myalgias.  Skin: Negative for rash.  Neurological: Positive for headaches. Negative for dizziness, weakness and light-headedness.  All other systems reviewed and are negative.   Allergies  Amoxicillin; Clindamycin hcl; and Diclofenac  Home Medications   Prior to Admission medications   Medication Sig Start Date End Date Taking? Authorizing Provider  butalbital-acetaminophen-caffeine (FIORICET) 50-325-40 MG per tablet Take 1-2 tablets by mouth every 4 (four) hours as needed for headache. Not more than 6 tablets per day, not more than 3 days per week 07/31/14 07/31/15  Graylon GoodZachary H Helga Asbury, PA-C  cetirizine (ZYRTEC) 10 MG tablet Take 10 mg by mouth daily.    Historical Provider, MD  cholecalciferol (VITAMIN D) 1000 UNITS tablet Take 1,000 Units by mouth daily.     Historical Provider, MD  HYDROcodone-acetaminophen (NORCO/VICODIN) 5-325 MG per tablet Take 1-2 tablets by mouth every 6 (six) hours as needed. 06/23/14   Rodolph BongEvan S Corey, MD  metFORMIN (GLUCOPHAGE) 1000 MG tablet Take 500 mg by mouth 2 (two) times daily with a meal.     Historical Provider, MD  Multiple Vitamins-Iron (MULTIVITAMIN/IRON PO) Take 1 tablet by mouth daily.    Historical Provider, MD  norgestrel-ethinyl estradiol (ELINEST) 0.3-30 MG-MCG tablet Take 1 tablet by mouth daily.    Historical Provider, MD  pantoprazole (PROTONIX) 40 MG tablet Take 40 mg by mouth daily.    Historical Provider, MD  BP 140/88  Pulse 75  Temp(Src) 98.2 F (36.8 C) (Oral)  Resp 20  Ht 5\' 6"  (1.676 m)  Wt 225 lb (102.059 kg)  BMI 36.33 kg/m2  SpO2 98%  LMP 07/24/2014 Physical Exam  Nursing note and vitals reviewed. Constitutional: She is oriented to person, place, and time. Vital signs are normal. She appears well-developed and well-nourished. No distress.  HENT:  Head: Normocephalic and atraumatic.  Right Ear:  External ear normal.  Left Ear: External ear normal.  Nose: Nose normal.  Mouth/Throat: Oropharynx is clear and moist. No oropharyngeal exudate.  Eyes: Conjunctivae and EOM are normal. Right eye exhibits no discharge. Left eye exhibits no discharge.  Cardiovascular: Normal rate, regular rhythm and normal heart sounds.   Pulmonary/Chest: Effort normal and breath sounds normal. No respiratory distress.  Neurological: She is alert and oriented to person, place, and time. She has normal strength and normal reflexes. No cranial nerve deficit or sensory deficit. She exhibits normal muscle tone. She displays a negative Romberg sign. Coordination and gait normal. GCS eye subscore is 4. GCS verbal subscore is 5. GCS motor subscore is 6.  Alert and oriented 3. Cranial nerves II through XII are intact. Coronary muscle movements, normal gait. Able to perform heel to shin as well as finger to nose without difficulty. DTRs normal and symmetric.  Skin: Skin is warm and dry. No rash noted. She is not diaphoretic.  Psychiatric: She has a normal mood and affect. Judgment normal.    ED Course  Procedures (including critical care time) Labs Review Labs Reviewed - No data to display  Imaging Review No results found.  1830: Normal neurologic exam without any focality or global deficits, most likely this is a first migraine headache. We'll treat with migraine cocktail of Reglan, diphenhydramine, Toradol, and reassess. She does not have a ride with her but she says her husband can come pick her up if we give her sedating medications. Pain 9 out of 10 prior to medications  1930: Patient got medications 30 minutes ago. Her blood pressure is now down to 140/88. Pain is now 7 out of 10. We'll continue to monitor  2005: HA down to 4/10.  Neuro exam still normal.  Pt requesting discharge, says she feels much better and feels safe to go home.   MDM   1. Nonintractable migraine, unspecified migraine type    Patient  had significant improvement with headache cocktail. Neurologic exam remains normal. We'll discharge and provide a prescription for Fioricet to take if the headache recurs. Patient is instructed to follow-up with neurology. In the headache comes back in the next day and does not go away with Fioricet she needs to go to the emergency department. Otherwise she may follow up here if that happens later  Meds ordered this encounter  Medications  . ketorolac (TORADOL) injection 60 mg    Sig:   . metoCLOPramide (REGLAN) injection 10 mg    Sig:   . diphenhydrAMINE (BENADRYL) injection 25 mg    Sig:   . butalbital-acetaminophen-caffeine (FIORICET) 50-325-40 MG per tablet    Sig: Take 1-2 tablets by mouth every 4 (four) hours as needed for headache. Not more than 6 tablets per day, not more than 3 days per week    Dispense:  20 tablet    Refill:  0    Order Specific Question:  Supervising Provider    Answer:  Bradd CanaryKINDL, JAMES D [[id number]]       Graylon GoodZachary H Lincoln Kleiner, PA-C 07/31/14  2011 

## 2014-07-31 NOTE — ED Notes (Signed)
38 year old female with a headache, nausea, photosensitivity for the past 4 hours has tried Tramadol, drinking a lot of water and  Also caffeine with no relief at all.

## 2014-07-31 NOTE — ED Provider Notes (Signed)
Medical screening examination/treatment/procedure(s) were performed by resident physician or non-physician practitioner and as supervising physician I was immediately available for consultation/collaboration.   Jaycie Kregel DOUGLAS MD.   Glendora Clouatre D Linard Daft, MD 07/31/14 2049 

## 2014-07-31 NOTE — Discharge Instructions (Signed)

## 2014-08-20 NOTE — Progress Notes (Signed)
Patient ID: Angelica FesterSara Riede, female   DOB: 02/12/1976, 38 y.o.   MRN: [id number] Reviewed: Agree with the documentation and management of our [facility] Pharmacologist.

## 2014-09-16 ENCOUNTER — Other Ambulatory Visit: Payer: Self-pay | Admitting: Specialist

## 2014-09-16 DIAGNOSIS — M545 Low back pain, unspecified: Secondary | ICD-10-CM

## 2014-09-24 ENCOUNTER — Ambulatory Visit
Admission: RE | Admit: 2014-09-24 | Discharge: 2014-09-24 | Disposition: A | Discharge status: Home / Self Care | Payer: Worker's Compensation | Source: Ambulatory Visit | Attending: Specialist | Admitting: Specialist

## 2014-09-24 DIAGNOSIS — M545 Low back pain, unspecified: Secondary | ICD-10-CM

## 2014-09-24 MED ORDER — IOHEXOL 180 MG/ML  SOLN
15.0000 mL | Freq: Once | INTRAMUSCULAR | Status: AC | PRN
  Administered 2014-09-24: 15 mL via INTRATHECAL

## 2014-09-24 MED ORDER — MEPERIDINE HCL 100 MG/ML IJ SOLN
100.0000 mg | Freq: Once | INTRAMUSCULAR | Status: AC
  Administered 2014-09-24: 100 mg via INTRAMUSCULAR

## 2014-09-24 MED ORDER — ONDANSETRON HCL 4 MG/2ML IJ SOLN
4.0000 mg | Freq: Once | INTRAMUSCULAR | Status: AC
  Administered 2014-09-24: 4 mg via INTRAMUSCULAR

## 2014-09-24 MED ORDER — DIAZEPAM 5 MG PO TABS
10.0000 mg | ORAL_TABLET | Freq: Once | ORAL | Status: AC
Start: 2014-09-24 — End: 2014-09-24
  Administered 2014-09-24: 10 mg via ORAL

## 2014-09-24 NOTE — Progress Notes (Signed)
Pt states she has been off Tramadol for at least the last 2 days. Discharge instructions explained to pt.

## 2014-09-24 NOTE — Discharge Instructions (Signed)
Myelogram Discharge Instructions  1. Go home and rest quietly for the next 24 hours.  It is important to lie flat for the next 24 hours.  Get up only to go to the restroom.  You may lie in the bed or on a couch on your back, your stomach, your left side or your right side.  You may have one pillow under your head.  You may have pillows between your knees while you are on your side or under your knees while you are on your back.  2. DO NOT drive today.  Recline the seat as far back as it will go, while still wearing your seat belt, on the way home.  3. You may get up to go to the bathroom as needed.  You may sit up for 10 minutes to eat.  You may resume your normal diet and medications unless otherwise indicated.  Drink lots of extra fluids today and tomorrow.  4. The incidence of headache, nausea, or vomiting is about 5% (one in 20 patients).  If you develop a headache, lie flat and drink plenty of fluids until the headache goes away.  Caffeinated beverages may be helpful.  If you develop severe nausea and vomiting or a headache that does not go away with flat bed rest, call [phone].  5. You may resume normal activities after your 24 hours of bed rest is over; however, do not exert yourself strongly or do any heavy lifting tomorrow. If when you get up you have a headache when standing, go back to bed and force fluids for another 24 hours.  6. Call your physician for a follow-up appointment.  The results of your myelogram will be sent directly to your physician by the following day.  7. If you have any questions or if complications develop after you arrive home, please call [phone].  Discharge instructions have been explained to the patient.  The patient, or the person responsible for the patient, fully understands these instructions.      May resume Tramadol on Dec. 23, 2015, after 11:00 am.

## 2014-09-26 ENCOUNTER — Encounter (HOSPITAL_COMMUNITY): Payer: Self-pay | Admitting: Emergency Medicine

## 2014-09-26 ENCOUNTER — Emergency Department (HOSPITAL_COMMUNITY)
Admission: EM | Admit: 2014-09-26 | Discharge: 2014-09-26 | Disposition: A | Discharge status: Home / Self Care | Payer: PRIVATE HEALTH INSURANCE | Attending: Emergency Medicine | Admitting: Emergency Medicine

## 2014-09-26 ENCOUNTER — Encounter (HOSPITAL_COMMUNITY): Payer: Self-pay | Admitting: *Deleted

## 2014-09-26 ENCOUNTER — Emergency Department (INDEPENDENT_AMBULATORY_CARE_PROVIDER_SITE_OTHER)
Admission: EM | Admit: 2014-09-26 | Discharge: 2014-09-26 | Disposition: A | Discharge status: Nursing Home (Includes ICF) | Payer: 59 | Source: Home / Self Care | Attending: Family Medicine | Admitting: Family Medicine

## 2014-09-26 DIAGNOSIS — Z880 Allergy status to penicillin: Secondary | ICD-10-CM | POA: Insufficient documentation

## 2014-09-26 DIAGNOSIS — G971 Other reaction to spinal and lumbar puncture: Secondary | ICD-10-CM | POA: Insufficient documentation

## 2014-09-26 DIAGNOSIS — E669 Obesity, unspecified: Secondary | ICD-10-CM | POA: Insufficient documentation

## 2014-09-26 DIAGNOSIS — R519 Headache, unspecified: Secondary | ICD-10-CM

## 2014-09-26 DIAGNOSIS — E119 Type 2 diabetes mellitus without complications: Secondary | ICD-10-CM | POA: Diagnosis not present

## 2014-09-26 DIAGNOSIS — R51 Headache: Secondary | ICD-10-CM | POA: Diagnosis present

## 2014-09-26 DIAGNOSIS — Z3202 Encounter for pregnancy test, result negative: Secondary | ICD-10-CM | POA: Insufficient documentation

## 2014-09-26 DIAGNOSIS — J069 Acute upper respiratory infection, unspecified: Secondary | ICD-10-CM | POA: Insufficient documentation

## 2014-09-26 DIAGNOSIS — Z79899 Other long term (current) drug therapy: Secondary | ICD-10-CM | POA: Diagnosis not present

## 2014-09-26 LAB — POC URINE PREG, ED: PREG TEST UR: NEGATIVE

## 2014-09-26 MED ORDER — SODIUM CHLORIDE 0.9 % IV BOLUS (SEPSIS)
1000.0000 mL | Freq: Once | INTRAVENOUS | Status: AC
  Administered 2014-09-26: 1000 mL via INTRAVENOUS

## 2014-09-26 MED ORDER — DIPHENHYDRAMINE HCL 50 MG/ML IJ SOLN
25.0000 mg | Freq: Once | INTRAMUSCULAR | Status: AC
  Administered 2014-09-26: 25 mg via INTRAVENOUS
  Filled 2014-09-26: qty 1

## 2014-09-26 MED ORDER — BUTALBITAL-APAP-CAFFEINE 50-325-40 MG PO TABS
1.0000 | ORAL_TABLET | Freq: Once | ORAL | Status: AC
  Administered 2014-09-26: 1 via ORAL
  Filled 2014-09-26: qty 1

## 2014-09-26 MED ORDER — BUTALBITAL-APAP-CAFFEINE 50-325-40 MG PO TABS: 1.0000 | ORAL_TABLET | Freq: Four times a day (QID) | ORAL | Status: AC | PRN

## 2014-09-26 MED ORDER — PROCHLORPERAZINE EDISYLATE 5 MG/ML IJ SOLN
10.0000 mg | Freq: Four times a day (QID) | INTRAMUSCULAR | Status: DC | PRN
  Administered 2014-09-26: 10 mg via INTRAVENOUS
  Filled 2014-09-26: qty 2

## 2014-09-26 NOTE — ED Notes (Signed)
Pt presents with a headache onset this morning- reports headache goes away when she lies down but is present when she is sitting or standing.  [facility]  Denies numbness or tingling to extremities- pt alert and oriented X 4 at this time.

## 2014-09-26 NOTE — Discharge Instructions (Signed)
Spinal Headache A spinal headache is a severe headache that can happen after getting a spinal tap, also called lumbar puncture, or an epidural anesthetic. Both of these procedures involve passing a needle through ligaments that run along the back side of your spinal column and into one of the spaces just above your spinal cord. Sometimes spinal fluid leaks through the temporary hole left by the needle. This leak causes a decrease in spinal fluid pressure, which leads to a spinal headache. The headache usually begins within hours or 1-2 days after the procedure, and it lasts until adequate pressure returns as your body creates more spinal fluid. The headache can last a few days and rarely lasts for more than 1 week. SIGNS AND SYMPTOMS   Severe headache pain when sitting or standing.  Decreased headache pain when lying down.  Neck pain, especially when flexing the neck in a chin-to-chest position.  Vomiting. DIAGNOSIS  Diagnosis of spinal headache is usually made based on your recent medical history. Your health care provider will consider the timing of a recent spinal tap or epidural anesthetic, along with how soon your headache occurred afterward. On rare occasions, tests may be done to confirm the diagnosis, such as an MRI. TREATMENT  Treatment may include:  Drinking extra fluids to improve your level of hydration. This will help your body replace the spinal fluid that has leaked out through the needle hole. Receiving IV fluids may be necessary.  Taking pain medicine as prescribed by your health care provider.  Drinking caffeinated beverages such as soda, coffee, or tea. Caffeine may help to shrink the blood vessels in your brain, which may reduce your headache pain.  Lying flat for a few days.  Having a blood patch procedure, which involves injecting a small amount of your blood at the puncture site to seal the leak. HOME CARE INSTRUCTIONS  Lie down to relieve pain if your pain gets  worse when you sit or stand.  Drink enough fluids to keep your urine clear or pale yellow.  Take pain medicine as directed by your health care provider. SEEK IMMEDIATE MEDICAL CARE IF:   Your pain becomes very severe or cannot be controlled.  You develop a fever.  You have a stiff neck.  You lose bowel or bladder control.  You have trouble walking. MAKE SURE YOU:  Understand these instructions.  Will watch your condition.   Will get help right away if you are not doing well or get worse. Document Released: 03/12/2002 Document Revised: 09/25/2013 Document Reviewed: 04/12/2013 [facility] Patient Information 2015 [employer] [place]. This information is not intended to replace advice given to you by your health care provider. Make sure you discuss any questions you have with your health care provider.  Cough, Adult  A cough is a reflex that helps clear your throat and airways. It can help heal the body or may be a reaction to an irritated airway. A cough may only last 2 or 3 weeks (acute) or may last more than 8 weeks (chronic).  CAUSES Acute cough:  Viral or bacterial infections. Chronic cough:  Infections.  Allergies.  Asthma.  Post-nasal drip.  Smoking.  Heartburn or acid reflux.  Some medicines.  Chronic lung problems (COPD).  Cancer. SYMPTOMS   Cough.  Fever.  Chest pain.  Increased breathing rate.  High-pitched whistling sound when breathing (wheezing).  Colored mucus that you cough up (sputum). TREATMENT   A bacterial cough may be treated with antibiotic medicine.  A viral  cough must run its course and will not respond to antibiotics.  Your caregiver may recommend other treatments if you have a chronic cough. HOME CARE INSTRUCTIONS   Only take over-the-counter or prescription medicines for pain, discomfort, or fever as directed by your caregiver. Use cough suppressants only as directed by your caregiver.  Use a cold steam vaporizer or  humidifier in your bedroom or home to help loosen secretions.  Sleep in a semi-upright position if your cough is worse at night.  Rest as needed.  Stop smoking if you smoke. SEEK IMMEDIATE MEDICAL CARE IF:   You have pus in your sputum.  Your cough starts to worsen.  You cannot control your cough with suppressants and are losing sleep.  You begin coughing up blood.  You have difficulty breathing.  You develop pain which is getting worse or is uncontrolled with medicine.  You have a fever. MAKE SURE YOU:   Understand these instructions.  Will watch your condition.  Will get help right away if you are not doing well or get worse. Document Released: 03/19/2011 Document Revised: 12/13/2011 Document Reviewed: 03/19/2011 [facility] Patient Information 2015 [place], [place]. This information is not intended to replace advice given to you by your health care provider. Make sure you discuss any questions you have with your health care provider.

## 2014-09-26 NOTE — ED Provider Notes (Signed)
I saw and evaluated the patient, reviewed the resident's note and I agree with the findings and plan.  Pt has symptoms consistent with a spinal headache.   On my exam she is asymptomatic supine.  No meningismus.  Will treat symptomatically.  If symptoms persist will consult with radiology regarding possible blood patch.  Linwood DibblesJon Rakan Soffer, MD 09/26/14 [phone]

## 2014-09-26 NOTE — ED Notes (Signed)
Pt      Reports   Headache       This  Am      Frontal area       Had  mylogram    sev  Days  Ago           She  Reports  Pain is  Worse  When  She  Sits   Up          She  Reports  Has  Had  Headaches  In past  But not like  This    -  She  Reports  Symptoms not  releived  By OTC  MEDS      pT IS  AWAKE  AND  ALERT  BUT   REPORTS  PAIN

## 2014-09-26 NOTE — ED Provider Notes (Signed)
CSN: [id number]     Arrival date & time 09/26/14  1634 History   First MD Initiated Contact with Patient 09/26/14 1651     Chief Complaint  Patient presents with  . Headache   (Consider location/radiation/quality/duration/timing/severity/associated sxs/prior Treatment) HPI Comments: Patient [facility] (Dr. Curnes/Dr. Shelle IronBeane) on 09/24/2014 and states she complied with 24-36 hours of flat bed rest and plenty of hydration following procedure. When she has tried to get up following bed rest she report severe headache with sitting or standing with associated dizziness and lightheadedness. These symptoms have not responded favorably to Vicodin.   Patient is a 38 y.o. female presenting with headaches. The history is provided by the patient.  Headache   Past Medical History  Diagnosis Date  . Diabetes mellitus   . Obese    History reviewed. No pertinent past surgical history. History reviewed. No pertinent family history. History  Substance Use Topics  . Smoking status: Never Smoker   . Smokeless tobacco: Never Used  . Alcohol Use: No   OB History    No data available     Review of Systems  Neurological: Positive for headaches.  All other systems reviewed and are negative.   Allergies  Amoxicillin; Clindamycin hcl; and Diclofenac  Home Medications   Prior to Admission medications   Medication Sig Start Date End Date Taking? Authorizing Provider  butalbital-acetaminophen-caffeine (FIORICET) 50-325-40 MG per tablet Take 1-2 tablets by mouth every 4 (four) hours as needed for headache. Not more than 6 tablets per day, not more than 3 days per week 07/31/14 07/31/15  Graylon GoodZachary H Baker, PA-C  cetirizine (ZYRTEC) 10 MG tablet Take 10 mg by mouth daily.    Historical Provider, MD  cholecalciferol (VITAMIN D) 1000 UNITS tablet Take 1,000 Units by mouth daily.     Historical Provider, MD  HYDROcodone-acetaminophen (NORCO/VICODIN) 5-325 MG per tablet  Take 1-2 tablets by mouth every 6 (six) hours as needed. 06/23/14   Rodolph BongEvan S Corey, MD  metFORMIN (GLUCOPHAGE) 1000 MG tablet Take 500 mg by mouth 2 (two) times daily with a meal.     Historical Provider, MD  Multiple Vitamins-Iron (MULTIVITAMIN/IRON PO) Take 1 tablet by mouth daily.    Historical Provider, MD  norgestrel-ethinyl estradiol (ELINEST) 0.3-30 MG-MCG tablet Take 1 tablet by mouth daily.    Historical Provider, MD  pantoprazole (PROTONIX) 40 MG tablet Take 40 mg by mouth daily.    Historical Provider, MD   BP 152/98 mmHg  Pulse 99  Temp(Src) 99.1 F (37.3 C) (Oral)  Resp 16  SpO2 99%  LMP 09/19/2014 Physical Exam  Constitutional: She is oriented to person, place, and time. She appears well-developed and well-nourished.  +appears uncomfortable  HENT:  Head: Normocephalic and atraumatic.  Eyes: Conjunctivae are normal.  Neck: Trachea normal and phonation normal. Neck supple. No rigidity. Decreased range of motion present.  Reports severe frontal headache with flexion of neck  Cardiovascular: Normal rate.   Pulmonary/Chest: Effort normal.  Musculoskeletal:  Injection site at LS spine region appears to be without issue. Non-tender, no ecchymosis or STS. No erythema  Neurological: She is alert and oriented to person, place, and time. She has normal strength. No cranial nerve deficit or sensory deficit. GCS eye subscore is 4. GCS verbal subscore is 5. GCS motor subscore is 6.  +antalgic gait  Skin: Skin is warm and dry. No rash noted. No erythema.  Psychiatric: She has a normal mood and affect. Her behavior is  normal.  Nursing note and vitals reviewed.   ED Course  Procedures (including critical care time) Labs Review Labs Reviewed - No data to display  Imaging Review No results found.   MDM   1. Acute intractable headache, unspecified headache type   I have concern that this patient may have spinal fluid leak causing headaches and dizziness following recent LS spine  myelogram. [facility].   Ria ClockJennifer Lee H Presson, [place]PA 09/26/14 [phone]

## 2014-09-26 NOTE — Discharge Instructions (Signed)
It would be my recommendation [facility]. It is possible that you will require imaging to determine if you have a spinal fluid leak following your recent lumbar spine myelogram.

## 2014-09-26 NOTE — ED Notes (Signed)
Discharge instructions given, voiced understanding 

## 2014-09-26 NOTE — ED Provider Notes (Signed)
CSN: [id number]     Arrival date & time 09/26/14  1727 History   First MD Initiated Contact with Patient 09/26/14 1743     Chief Complaint  Patient presents with  . Headache     (Consider location/radiation/quality/duration/timing/severity/associated sxs/prior Treatment) Patient is a 38 y.o. female presenting with headaches.  Headache Pain location:  Generalized Quality:  Dull (pressure) Radiates to:  Does not radiate Severity currently:  2/10 Severity at highest:  8/10 Onset quality:  Gradual Duration:  2 days Timing:  Constant Progression:  Waxing and waning Chronicity:  New Similar to prior headaches: no   Context: activity   Context comment:  Standing, sitting up Relieved by: laying down. Exacerbated by: standing up. Ineffective treatments:  None tried Associated symptoms: congestion, cough, sore throat and URI   Associated symptoms: no abdominal pain, no back pain, no drainage, no facial pain, no fever, no hearing loss, no loss of balance, no nausea, no neck pain, no numbness, no paresthesias, no vomiting and no weakness   Risk factors comment:  Recent myelogram 2 days ago   Past Medical History  Diagnosis Date  . Diabetes mellitus   . Obese    History reviewed. No pertinent past surgical history. No family history on file. History  Substance Use Topics  . Smoking status: Never Smoker   . Smokeless tobacco: Never Used  . Alcohol Use: No   OB History    No data available     Review of Systems  Constitutional: Negative for fever.  HENT: Positive for congestion and sore throat. Negative for hearing loss and postnasal drip.   Eyes: Negative for visual disturbance.  Respiratory: Positive for cough. Negative for shortness of breath.   Cardiovascular: Negative for chest pain.  Gastrointestinal: Negative for nausea, vomiting and abdominal pain.  Genitourinary: Negative for difficulty urinating.  Musculoskeletal: Negative for back pain and neck pain.  Skin:  Negative for rash.  Neurological: Positive for headaches. Negative for syncope, numbness, paresthesias and loss of balance.      Allergies  Amoxicillin; Clindamycin hcl; and Diclofenac  Home Medications   Prior to Admission medications   Medication Sig Start Date End Date Taking? Authorizing Provider  butalbital-acetaminophen-caffeine (FIORICET) 50-325-40 MG per tablet Take 1-2 tablets by mouth every 4 (four) hours as needed for headache. Not more than 6 tablets per day, not more than 3 days per week 07/31/14 07/31/15  Graylon GoodZachary H Baker, PA-C  cetirizine (ZYRTEC) 10 MG tablet Take 10 mg by mouth daily.    Historical Provider, MD  cholecalciferol (VITAMIN D) 1000 UNITS tablet Take 1,000 Units by mouth daily.     Historical Provider, MD  HYDROcodone-acetaminophen (NORCO/VICODIN) 5-325 MG per tablet Take 1-2 tablets by mouth every 6 (six) hours as needed. 06/23/14   Rodolph BongEvan S Corey, MD  metFORMIN (GLUCOPHAGE) 1000 MG tablet Take 500 mg by mouth 2 (two) times daily with a meal.     Historical Provider, MD  Multiple Vitamins-Iron (MULTIVITAMIN/IRON PO) Take 1 tablet by mouth daily.    Historical Provider, MD  norgestrel-ethinyl estradiol (ELINEST) 0.3-30 MG-MCG tablet Take 1 tablet by mouth daily.    Historical Provider, MD  pantoprazole (PROTONIX) 40 MG tablet Take 40 mg by mouth daily.    Historical Provider, MD   BP 131/91 mmHg  Pulse 101  Temp(Src) 97.9 F (36.6 C) (Oral)  Resp 18  Ht 5\' 6"  (1.676 m)  Wt 220 lb (99.791 kg)  BMI 35.53 kg/m2  SpO2 99%  LMP 09/19/2014 Physical  Exam  Constitutional: She is oriented to person, place, and time. She appears well-developed and well-nourished. No distress.  HENT:  Head: Normocephalic and atraumatic.  Eyes: Conjunctivae and EOM are normal.  Neck: Normal range of motion.  Cardiovascular: Normal rate, regular rhythm, normal heart sounds and intact distal pulses.  Exam reveals no gallop and no friction rub.   No murmur heard. Pulmonary/Chest:  Effort normal and breath sounds normal. No respiratory distress. She has no wheezes. She has no rales.  Abdominal: Soft. She exhibits no distension. There is no tenderness. There is no guarding.  Musculoskeletal: She exhibits no edema or tenderness.  Neurological: She is alert and oriented to person, place, and time. She has normal strength. No cranial nerve deficit or sensory deficit. She displays a negative Romberg sign. Coordination and gait normal. GCS eye subscore is 4. GCS verbal subscore is 5. GCS motor subscore is 6.  Skin: Skin is warm and dry. No rash noted. She is not diaphoretic. No erythema.  Nursing note and vitals reviewed.   ED Course  Procedures (including critical care time) Labs Review Labs Reviewed - No data to display  Imaging Review No results found.   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 The headache is strictly positional concerning for a post-lumbar spinal headache. She has a normal neurologic exam, is afebrile, has normal range of motion of her neck, and low suspicion for meningitis or subarachnoid hemorrhage.   Will administer 1 L bolus of normal saline, and a headache cocktail consisting of Compazine and Benadryl and fiorecet.    Patient reported improvement of headache, was able to stand and walk without pain.  Recommended continued hydration, wrote rx for fiorecet and recommended follow up with PCP.  Discussed that if symptoms again worsened or fail outpt management she should discuss with IR who performed the procedure. Patient discharged in stable condition with understanding of reasons to return.    Rhae LernerErin Elizabeth Albirtha Grinage, MD 09/27/14 [phone]

## 2014-09-26 NOTE — ED Notes (Signed)
IV removed, cath intact, site without redness or swelling

## 2015-02-04 ENCOUNTER — Ambulatory Visit: Payer: Self-pay | Admitting: Pharmacist

## 2015-02-06 ENCOUNTER — Ambulatory Visit (INDEPENDENT_AMBULATORY_CARE_PROVIDER_SITE_OTHER): Payer: Self-pay | Admitting: Family Medicine

## 2015-02-06 VITALS — BP 147/90 | HR 70 | Ht 66.0 in | Wt 222.0 lb

## 2015-02-06 DIAGNOSIS — E119 Type 2 diabetes mellitus without complications: Secondary | ICD-10-CM

## 2015-02-06 NOTE — Patient Instructions (Signed)
1. Move the atorvastatin to the evening 2. Increase every exercise session to be AT LEAST 30 minutes per session. 3. Call or email me your A1c and lipids from your most recent check from Dr. Marlana Latusdem. 4. Sit down during the weekend (or another time when you have some downtime) and plan the days of the week you will exercise.

## 2015-02-06 NOTE — Patient Outreach (Signed)
Subjective:  Patient presents today for 3 month diabetes follow-up as part of the employer-sponsored Link to Wellness program.  Current diabetes regimen includes metformin.  Patient also continues on daily ASA and statin.  Most recent MD follow-up was 01/23/15.    Assessment/Plan:  Patient is a 39 yo female with DM 2. Most recent A1C was   6.8% which is at goal less than 7%. Weight is decreased 2 lbs from last visit with me.   Lifestyle improvements:   Physical Activity-  Continue to exercise and increase regularity and strength. Currently 20ish minutes a few days a week. Sit down on Saturday or Sunday and plan out the days of exercise for the week rather than just expecting they will happen amid the stress. Recommended moving dose of atorvastatin to evening instead of morning.  Follow up with me in 2-3 months, depending on results of pending A1c from Primary Care.   Goals for Next Visit:  1. Move the atorvastatin to the evening 2. Increase every exercise session to be AT LEAST 30 minutes per session. 3. Call or email me your A1c and lipids from your most recent check from Dr. Marlana Latusdem. 4. Sit down during the weekend (or another time when you have some downtime) and plan the days of the week you will exercise.

## 2015-02-08 ENCOUNTER — Telehealth: Payer: Self-pay | Admitting: Family

## 2015-02-08 DIAGNOSIS — R319 Hematuria, unspecified: Principal | ICD-10-CM

## 2015-02-08 DIAGNOSIS — N390 Urinary tract infection, site not specified: Secondary | ICD-10-CM

## 2015-02-08 MED ORDER — SULFAMETHOXAZOLE-TRIMETHOPRIM 800-160 MG PO TABS: 1.0000 | ORAL_TABLET | Freq: Two times a day (BID) | ORAL | Status: DC

## 2015-02-08 NOTE — Progress Notes (Signed)

## 2015-02-13 NOTE — Progress Notes (Deleted)
ATTENDING PHYSICIAN NOTE: I have reviewed the chart and agree with the plan as detailed above. Bernese Enda Santo MD Pager [phone]  

## 2015-02-19 NOTE — Progress Notes (Signed)
Subjective:  Patient presents today for 3 month diabetes follow-up as part of the employer-sponsored Link to Wellness program.  Current diabetes regimen includes metformin.  Patient also continues on daily ASA and statin.  Most recent MD follow-up was 02/05/2015. No med changes or major health changes at this time.    Assessment/Plan:  Patient is a 39 yo female with DM 2. Most recent A1C was  7.1% which is very near goal of less than 7%.   Lifestyle improvements:  Physical Activity-  Angelica Carroll reports increasing her walking habits with her husband and intends to make it more consistent. She will start planning times during the week to walk with him.  Nutrition-  Diet has improved slowly over time. Continued progress reported.  Follow up with me in 3 months.    Goals for Next Visit:  1. Move the atorvastatin to the evening  2. Increase every exercise session to be AT LEAST 30 minutes per session.  3. Call or email me your A1c and lipids from your most recent check from Dr. Marlana Latusdem.  4. Sit down during the weekend (or another time when you have some downtime) and plan the days of the week you will exercise     Next appointment to see me is: 05/06/2015 @ 12:30PM

## 2015-02-27 NOTE — Progress Notes (Signed)
ATTENDING PHYSICIAN NOTE: I have reviewed the chart and agree with the plan as detailed above. Taniyah Kimani Bedoya MD Pager [phone]  

## 2015-05-13 ENCOUNTER — Ambulatory Visit: Payer: Self-pay | Admitting: Pharmacist

## 2015-05-21 ENCOUNTER — Encounter: Payer: Self-pay | Admitting: Pharmacist

## 2015-06-25 NOTE — Patient Outreach (Signed)
Patient did not show for appointment. This was a rescheduled appointment via email to care coordinator, Toni Amend.

## 2015-07-22 ENCOUNTER — Ambulatory Visit (INDEPENDENT_AMBULATORY_CARE_PROVIDER_SITE_OTHER): Payer: Self-pay | Admitting: Family Medicine

## 2015-07-22 ENCOUNTER — Encounter: Payer: Self-pay | Admitting: Pharmacist

## 2015-07-22 VITALS — BP 134/83 | HR 71 | Ht 66.0 in | Wt 217.6 lb

## 2015-07-22 DIAGNOSIS — E119 Type 2 diabetes mellitus without complications: Secondary | ICD-10-CM

## 2015-07-22 NOTE — Progress Notes (Signed)
Subjective:  Patient presents today for 3 month diabetes follow-up as part of the employer-sponsored Link to Wellness program.  Current diabetes regimen includes metformin 1000mg  twice daily. Patient also continues on daily atorvastatin 10mg , no ACE-I (intolerant - cough) nor ARB (to inquire with provider about starting ARB therapy). No aspirin. Most recent PCP follow up was 04/17/15, to follow up within 30 days. No significant med or health changes at this time.  Assessment:  Diabetes: Most recent A1C was  7.2% which is exceeding goal of 7.0%. Weight is decreased 4.4 lbs since last visit with me.  Lifestyle improvements:  Physical Activity- Physical activity has decreased as stress and business at work has increased. Also limited by back pain. To restart as able due to back pain and lack of time.   Nutrition- Diet has improved in regards to portion control, until recently. Recently due to increased stress, office has been providing food (and snacks) which has led to poor choices. Also due to less time/more stress, she has been "eating on the run" more frequently which has been leading to imperfect diet selections. To start packing a healthy lunch so she doesn't have to make good decisions while time-limited.  Follow up with me in 3 months.    Plan/Goals for Next Visit:  1. Talk with Dr. Delena ServeWharton about whether you should start an ARB. This is a great replacement for the ACE-Inhibitor (lisinopril) which previously caused the cough. The ARB should not cause a cough. 2. Refocus on exercise as allowed by time and pain, but... 3. Diet is the primary focus. Start packing health lunches (to minimize difficult decision making while on the run). Improve water consumption to give your stomach a better feeling of fullness (may consider restarting using the SmartWater bottles).    Next appointment to see me is:

## 2015-07-25 ENCOUNTER — Other Ambulatory Visit (HOSPITAL_COMMUNITY)
Admission: RE | Admit: 2015-07-25 | Discharge: 2015-07-25 | Disposition: A | Discharge status: Home / Self Care | Payer: 59 | Source: Ambulatory Visit | Attending: Obstetrics & Gynecology | Admitting: Obstetrics & Gynecology

## 2015-07-25 ENCOUNTER — Other Ambulatory Visit: Payer: Self-pay

## 2015-07-25 ENCOUNTER — Other Ambulatory Visit: Payer: Self-pay | Admitting: Obstetrics & Gynecology

## 2015-07-25 DIAGNOSIS — R87810 Cervical high risk human papillomavirus (HPV) DNA test positive: Secondary | ICD-10-CM | POA: Insufficient documentation

## 2015-07-25 DIAGNOSIS — Z1231 Encounter for screening mammogram for malignant neoplasm of breast: Secondary | ICD-10-CM

## 2015-07-25 DIAGNOSIS — Z113 Encounter for screening for infections with a predominantly sexual mode of transmission: Secondary | ICD-10-CM | POA: Diagnosis present

## 2015-07-25 DIAGNOSIS — Z01411 Encounter for gynecological examination (general) (routine) with abnormal findings: Secondary | ICD-10-CM | POA: Insufficient documentation

## 2015-07-25 DIAGNOSIS — Z1151 Encounter for screening for human papillomavirus (HPV): Secondary | ICD-10-CM | POA: Insufficient documentation

## 2015-07-28 LAB — CYTOLOGY - PAP

## 2015-08-04 NOTE — Progress Notes (Signed)
I have reviewed this pharmacist's note and agree  

## 2015-08-22 ENCOUNTER — Ambulatory Visit
Admission: RE | Admit: 2015-08-22 | Discharge: 2015-08-22 | Disposition: A | Discharge status: Home / Self Care | Payer: 59 | Source: Ambulatory Visit

## 2015-08-22 ENCOUNTER — Other Ambulatory Visit: Payer: Self-pay | Admitting: Obstetrics & Gynecology

## 2015-08-22 DIAGNOSIS — Z1231 Encounter for screening mammogram for malignant neoplasm of breast: Secondary | ICD-10-CM

## 2015-08-26 ENCOUNTER — Other Ambulatory Visit: Payer: Self-pay | Admitting: Family Medicine

## 2015-08-26 DIAGNOSIS — R928 Other abnormal and inconclusive findings on diagnostic imaging of breast: Secondary | ICD-10-CM

## 2015-09-02 ENCOUNTER — Ambulatory Visit
Admission: RE | Admit: 2015-09-02 | Discharge: 2015-09-02 | Disposition: A | Discharge status: Home / Self Care | Payer: 59 | Source: Ambulatory Visit | Attending: Family Medicine | Admitting: Family Medicine

## 2015-09-02 DIAGNOSIS — R928 Other abnormal and inconclusive findings on diagnostic imaging of breast: Secondary | ICD-10-CM

## 2015-09-12 ENCOUNTER — Other Ambulatory Visit: Payer: Self-pay

## 2015-10-21 MED FILL — metFORMIN HCL 1000 MG TABS: 1000 | 90 days supply | Qty: 180 | Fill #1

## 2015-10-21 MED FILL — AMLODIPINE BESYLATE 10 MG T: 10 | 90 days supply | Qty: 90 | Fill #0

## 2015-10-21 MED FILL — PANTOPRAZOLE SOD DR 40 MG T: 40 | 90 days supply | Qty: 90 | Fill #0

## 2015-10-22 ENCOUNTER — Ambulatory Visit: Payer: Self-pay | Admitting: Pharmacist

## 2015-10-24 DIAGNOSIS — G2581 Restless legs syndrome: Secondary | ICD-10-CM | POA: Diagnosis not present

## 2015-10-24 DIAGNOSIS — Z862 Personal history of diseases of the blood and blood-forming organs and certain disorders involving the immune mechanism: Secondary | ICD-10-CM | POA: Diagnosis not present

## 2015-10-24 DIAGNOSIS — E785 Hyperlipidemia, unspecified: Secondary | ICD-10-CM | POA: Diagnosis not present

## 2015-10-24 DIAGNOSIS — R300 Dysuria: Secondary | ICD-10-CM | POA: Diagnosis not present

## 2015-10-24 DIAGNOSIS — Z7984 Long term (current) use of oral hypoglycemic drugs: Secondary | ICD-10-CM | POA: Diagnosis not present

## 2015-10-24 DIAGNOSIS — E119 Type 2 diabetes mellitus without complications: Secondary | ICD-10-CM | POA: Diagnosis not present

## 2015-10-24 DIAGNOSIS — N390 Urinary tract infection, site not specified: Secondary | ICD-10-CM | POA: Diagnosis not present

## 2015-10-24 DIAGNOSIS — I10 Essential (primary) hypertension: Secondary | ICD-10-CM | POA: Diagnosis not present

## 2015-10-24 MED FILL — LYRICA 75 MG CAPSULE: 75 | 30 days supply | Qty: 60 | Fill #0

## 2015-10-24 MED FILL — SULFAMETHOXAZOLE-TMP DS TAB: 800-160 | 7 days supply | Qty: 14 | Fill #0

## 2015-10-24 MED FILL — HYDROCODON-APAP 5-325: 5-325 | 15 days supply | Qty: 30 | Fill #0

## 2015-10-24 MED FILL — ATORVASTATIN 10 MG TABLET: 10 | 90 days supply | Qty: 90 | Fill #0

## 2015-10-24 NOTE — Patient Outreach (Signed)
Patient did not show up for appointment. No call, no show.

## 2015-11-05 MED FILL — TRIAMTERENE/HCTZ 37.5/25 TB: 37.5-25 | 90 days supply | Qty: 90 | Fill #0

## 2015-11-05 MED FILL — LARIN 1.5 MG-30 MCG TABLET: 1.5-30 | 84 days supply | Qty: 63 | Fill #2

## 2015-11-10 ENCOUNTER — Other Ambulatory Visit: Payer: Self-pay | Admitting: Obstetrics & Gynecology

## 2015-11-10 DIAGNOSIS — N939 Abnormal uterine and vaginal bleeding, unspecified: Secondary | ICD-10-CM | POA: Diagnosis not present

## 2015-11-10 DIAGNOSIS — N840 Polyp of corpus uteri: Secondary | ICD-10-CM | POA: Diagnosis not present

## 2015-11-10 DIAGNOSIS — Z01818 Encounter for other preprocedural examination: Secondary | ICD-10-CM | POA: Diagnosis not present

## 2015-11-10 DIAGNOSIS — Z3200 Encounter for pregnancy test, result unknown: Secondary | ICD-10-CM | POA: Diagnosis not present

## 2015-11-10 NOTE — Patient Instructions (Addendum)
Your procedure is scheduled on:  Thursday, Feb. 16, 2017  Enter through the [employer] of [facility] at:  10:00 A.M.  Pick up the phone at the desk and dial [phone].  Call this number if you have problems the morning of surgery: [phone].  Remember: Do NOT eat food or drink after:  Midnight Wednesday  Take these medicines the morning of surgery with a SIP OF WATER:  Amlodipine, Triamterene-HCTZ,  Protonix, Lyrica  *Do NOT take Metformin 24 hours prior to surgery  Do NOT wear jewelry (body piercing), metal hair clips/bobby pins, make-up, or nail polish. Do NOT wear lotions, powders, or perfumes.  You may wear deoderant. Do NOT shave for 48 hours prior to surgery. Do NOT bring valuables to the hospital. Contacts, dentures, or bridgework may not be worn into surgery.  Have a responsible adult drive you home and stay with you for 24 hours after your procedure

## 2015-11-11 ENCOUNTER — Encounter (HOSPITAL_COMMUNITY)
Admission: RE | Admit: 2015-11-11 | Discharge: 2015-11-11 | Disposition: A | Discharge status: Home / Self Care | Payer: 59 | Source: Ambulatory Visit | Attending: Obstetrics & Gynecology | Admitting: Obstetrics & Gynecology

## 2015-11-11 ENCOUNTER — Other Ambulatory Visit: Payer: Self-pay

## 2015-11-11 ENCOUNTER — Encounter (HOSPITAL_COMMUNITY): Payer: Self-pay

## 2015-11-11 DIAGNOSIS — Z01810 Encounter for preprocedural cardiovascular examination: Secondary | ICD-10-CM | POA: Insufficient documentation

## 2015-11-11 DIAGNOSIS — Z01812 Encounter for preprocedural laboratory examination: Secondary | ICD-10-CM | POA: Insufficient documentation

## 2015-11-11 HISTORY — DX: Other specified postprocedural states: Z98.890

## 2015-11-11 HISTORY — DX: Adverse effect of unspecified anesthetic, initial encounter: T41.45XA

## 2015-11-11 HISTORY — DX: Palpitations: R00.2

## 2015-11-11 HISTORY — DX: Dorsalgia, unspecified: M54.9

## 2015-11-11 HISTORY — DX: Headache: R51

## 2015-11-11 HISTORY — DX: Anemia, unspecified: D64.9

## 2015-11-11 HISTORY — DX: Essential (primary) hypertension: I10

## 2015-11-11 HISTORY — DX: Gastro-esophageal reflux disease without esophagitis: K21.9

## 2015-11-11 HISTORY — DX: Other complications of anesthesia, initial encounter: T88.59XA

## 2015-11-11 HISTORY — DX: Headache, unspecified: R51.9

## 2015-11-11 HISTORY — DX: Nausea with vomiting, unspecified: R11.2

## 2015-11-11 HISTORY — DX: Nonspecific reaction to tuberculin skin test without active tuberculosis: R76.11

## 2015-11-11 HISTORY — DX: Polycystic ovarian syndrome: E28.2

## 2015-11-11 LAB — CBC
HEMATOCRIT: 37.9 % (ref 36.0–46.0)
HEMOGLOBIN: 11.8 g/dL — AB (ref 12.0–15.0)
MCH: 23.2 pg — AB (ref 26.0–34.0)
MCHC: 31.1 g/dL (ref 30.0–36.0)
MCV: 74.6 fL — ABNORMAL LOW (ref 78.0–100.0)
Platelets: 365 10*3/uL (ref 150–400)
RBC: 5.08 MIL/uL (ref 3.87–5.11)
RDW: 16.7 % — ABNORMAL HIGH (ref 11.5–15.5)
WBC: 7.6 10*3/uL (ref 4.0–10.5)

## 2015-11-11 LAB — BASIC METABOLIC PANEL
ANION GAP: 8 (ref 5–15)
BUN: 9 mg/dL (ref 6–20)
CALCIUM: 8.8 mg/dL — AB (ref 8.9–10.3)
CO2: 25 mmol/L (ref 22–32)
Chloride: 103 mmol/L (ref 101–111)
Creatinine, Ser: 0.66 mg/dL (ref 0.44–1.00)
GLUCOSE: 221 mg/dL — AB (ref 65–99)
POTASSIUM: 4.1 mmol/L (ref 3.5–5.1)
Sodium: 136 mmol/L (ref 135–145)

## 2015-11-13 NOTE — H&P (Signed)
H&P  39yo G0 who presents for [facility], D&C, HTA ablation on de to AUB and dysmenorrhea. In review, she reports prolongation of her cycle lasting for 7-10 days with significant dysmenorrhea. Pt was started on a new OCP and has noted shortening of her cycle, but minimal change in her dysmenorrhea. At this point, she desires to proceed with a more permanent option to regulate her menses.  [place] 12/20: 10cm anteverted uterus- normal shape and size. Small 1.6 anterior fibroid- no other abnormalities noted. Ovaries with simple cyst- right cyst 3cm avascular, left 2cm avascular. No other abnormalities noted.   EMB- benign findings (11/10/15)  Current Medication:  Taking  Cetirizine HCl 10 MG Tablet 1 tablet Once a day     Tramadol HCl 50 MG Tablet 1 tablet Three times a day as needed, Notes: Ortho     Cyclobenzaprine HCl 5 MG Tablet 1 tablet Three times a day as needed, Notes: Ortho     Lyrica(Pregabalin) 75 MG Capsule 1 capsule Twice a day, Notes: Ortho     Junel 1.5/30(Norethindrone Acet-Ethinyl Est) 1.5-30 MG-MCG Tablet 1 tablet Daily for Three Weeks, 1 Week off     Pantoprazole Sodium 40 MG Tablet Delayed Release 1 tablet Once a day     Vitamin D 1000 UNIT Tablet 1 tablet Once a day     AZO Cranberry 250-30 MG Tablet     Triamterene-HCTZ 37.5-25 MG Tablet 1 tablet in the morning Once a day     Norvasc(AmLODIPine Besylate) 10 MG Tablet 1 tablet Once a day     Metformin HCl 1000 MG Tablet 1 tablet with meals twice a day     Atorvastatin Calcium 10 MG Tablet 1 tablet Once a day   Not-Taking/PRN  TRUEtest Test(Blood Glucose Test) Dx 250.00 Strip USE AS DIRECTED DAILY     Vitamin D 3 50,000 units pill 1 pill once a week    Medical History:   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 pylori negative     obesity highest weight 250     vitamin D deficiency     low back pain - Dr Shelle Iron -  medial branch blocks, L3-4- 03/15/15   Allergies/Intolerance:   Diclofenac Potassium - stomach upset     Amoxicillin     Cleocin     Lisinopril - cough   Gyn History:   Sexual activity currently sexually active.  Periods : irregular since stopping ocp.  LMP 10/14/2015.  Birth control none.  Last pap smear date 07/25/2015 ASCUS, HPV+. , Colpo CIN 1 (08/2015) Denies Last mammogram date 08/2015.  H/O Abnormal pap smear yes, assessed with colposcopy.  Denies STD none.  Menarche 12.   OB History:   Never been pregnant per patient.   Surgical History:   Denies Past Surgical History   Hospitalization:   Denies Past Hospitalization   Family History:   Father: alive 28 yrs, diagnosed with HTN    Mother: deceased 19 yrs, first MI at 24, uterine cancer, diagnosed with DM, CAD, CHF, COPD    Paternal Grand Mother: deceased    Maternal Grand Mother: deceased, Gastric cancer    Brother 1: alive 57 yrs, ETOH, diagnosed with DM, COPD    Sister 1: alive 16 yrs   aunt deceased from breast cancer, dx early. no colon cancer.  Social History:  General  Tobacco use  cigarettes: Never smoked  Tobacco history last updated 11/10/2015  Alcohol: yes, very rare.  Caffeine: yes, coffee, soda, tea.  no Recreational drug use.  DIET: low carb, no added salt.  no Exercise.  Marital Status: married.  Children: none.  OCCUPATION: employed, Photographer at [employer].   ROS: CONSTITUTIONAL no Appetite changes. no Chills. no Fatigue. no Fever.  CARDIOLOGY no Chest pain.  RESPIRATORY no Shortness of breath. no Cough.  UROLOGY no Dysuria. no Urinary frequency. no Urinary incontinence. no Urinary urgency.  GASTROENTEROLOGY no Change in bowel habits. no Change in bowel movements.  FEMALE REPRODUCTIVE no Abnormal vaginal discharge. no Breast lumps or discharge. no Breast pain. no Hot flashes. no Sexual problems. no Vaginal irritation. no Vaginal itching.  NEUROLOGY no Dizziness. no  Headache.  PSYCHOLOGY no Anxiety. no Depression.  SKIN no Rash. no Hives.  HEMATOLOGY/LYMPH no Anema, no blood thinners  O: Performed on 11/10/15 Vital Signs  Wt 227, Wt change 2.8 lb, Ht 66, BMI 36.63, Pulse sitting 77, BP sitting 134/82.   Examination  General Examination: GENERAL APPEARANCE well developed, well nourished.  SKIN: warm and dry, no rashes.  NECK: supple, normal appearance.  LUNGS: clear to auscultation bilaterally, no wheezes, rhonchi, rales.  HEART: no murmurs, regular rate and rhythm.  ABDOMEN: soft and not tender, no masses palpated, no rebound, no rigidity.  FEMALE GENITOURINARY: normal external genitalia, labia - unremarkable, vagina - pink moist mucosa, no lesions or abnormal discharge, cervix - no discharge or lesions or CMT, adnexa - no masses or tenderness, uterus - nontender and normal size on palpation.  MUSCULOSKELETAL no calf tenderness bilaterally.  EXTREMITIES: no edema present.  PSYCH appropriate mood and affect.     A/P: 40yo G0 who presents for hysteroscopy, D&C, HTA ablation due to AUB and dysmenorrhea -NPO -LR @ 125cc/hr -SCDs to OR -Antibiotics not indicated -Risk/benefit and potential side effects reviewed.  Questions and concerns were addressed and pt wishes to proceed with above procedure.  Myna Hidalgo, DO [phone] (pager) [phone] (office)

## 2015-11-14 ENCOUNTER — Other Ambulatory Visit (HOSPITAL_COMMUNITY): Payer: Self-pay

## 2015-11-17 ENCOUNTER — Ambulatory Visit (INDEPENDENT_AMBULATORY_CARE_PROVIDER_SITE_OTHER): Payer: 59 | Admitting: Allergy and Immunology

## 2015-11-17 ENCOUNTER — Encounter: Payer: Self-pay | Admitting: Allergy and Immunology

## 2015-11-17 VITALS — BP 134/88 | HR 92 | Temp 98.0°F | Resp 20 | Ht 66.14 in | Wt 225.5 lb

## 2015-11-17 DIAGNOSIS — J0100 Acute maxillary sinusitis, unspecified: Secondary | ICD-10-CM | POA: Diagnosis not present

## 2015-11-17 DIAGNOSIS — R05 Cough: Secondary | ICD-10-CM | POA: Diagnosis not present

## 2015-11-17 DIAGNOSIS — R059 Cough, unspecified: Secondary | ICD-10-CM | POA: Insufficient documentation

## 2015-11-17 DIAGNOSIS — E0800 Diabetes mellitus due to underlying condition with hyperosmolarity without nonketotic hyperglycemic-hyperosmolar coma (NKHHC): Secondary | ICD-10-CM

## 2015-11-17 DIAGNOSIS — J019 Acute sinusitis, unspecified: Secondary | ICD-10-CM | POA: Insufficient documentation

## 2015-11-17 MED ORDER — FLUTICASONE PROPIONATE 50 MCG/ACT NA SUSP: 2.0000 | Freq: Every day | NASAL | Status: DC

## 2015-11-17 MED ORDER — METHYLPREDNISOLONE ACETATE 80 MG/ML IJ SUSP
80.0000 mg | Freq: Once | INTRAMUSCULAR | Status: AC
Start: 2015-11-17 — End: 2015-11-17
  Administered 2015-11-17: 80 mg via INTRAMUSCULAR

## 2015-11-17 MED ORDER — AZITHROMYCIN 250 MG PO TABS: ORAL_TABLET | ORAL | Status: DC

## 2015-11-17 MED FILL — AZITHROMYCIN 250 MG TABLET: 250 | 5 days supply | Qty: 6 | Fill #0

## 2015-11-17 NOTE — Patient Instructions (Addendum)
Acute sinusitis  Depo-Medrol 80 mg was administered in the office.  A prescription has been provided for azithromycin, 500 mg on day 1 and 250 mg on days 2 through 5.  A prescription has been provided for fluticasone nasal spray, 2 sprays per nostril daily as needed. Proper nasal spray technique has been discussed and demonstrated.  Nasal saline lavage (NeilMed) as needed has been recommended along with instructions for proper administration.  Guaifenesin 1200 mg (plus/minus pseudoephedrine 120 mg) twice daily as needed with adequate hydration. Pseudoephedrine is only to be used for short-term relief of nasal/sinus congestion. Long-term use is discouraged due to potential side effects.   The patient has been asked to contact me if her symptoms persist, progress, or if she becomes febrile. Otherwise, she may return for follow up in 4 months.  Cough The patient's history and physical examination suggest upper airway cough syndrome secondary to sinusitis.  Spirometry today reveals normal ventilatory function.  Treatment plan as outlined above.  If the coughing persists or progresses despite this plan, we will evaluate further.  Diabetes  Angelica Carroll has been asked to carefully monitor blood glucose levels while on prednisone.  She has verbalized understanding and has agreed to do so.    Return in about 4 months (around 03/16/2016), or if symptoms worsen or fail to improve.

## 2015-11-17 NOTE — Assessment & Plan Note (Signed)
The patient's history and physical examination suggest upper airway cough syndrome secondary to sinusitis.  Spirometry today reveals normal ventilatory function. Treatment plan as outlined above.  If the coughing persists or progresses despite this plan, we will evaluate further. 

## 2015-11-17 NOTE — Progress Notes (Signed)
New Patient Note  RE: Angelica Carroll MRN: [id number] DOB: Jul 08, 1976 Date of Office Visit: 11/17/2015  Referring provider: Jarrett Soho, PA-C Primary care provider: Jarrett Soho, PA-C  Chief Complaint: Sinusitis and Cough   History of present illness: HPI Comments: Angelica Carroll is a 40 y.o. female who presents today for consultation of rhinosinusitis.  She reports that ver this past week she experienced progressively increasing sinus pressure/pain, nasal congestion, ear fullness/pain (right greater than left), thick postnasal drainage, sore throat, coughing, possible fevers, and discolored mucus production. She has been attempting to alleviate these symptoms with DayQuil, cough drops, and rest.   Assessment and plan: Acute sinusitis  Depo-Medrol 80 mg was administered in the office.  A prescription has been provided for azithromycin, 500 mg on day 1 and 250 mg on days 2 through 5.  A prescription has been provided for fluticasone nasal spray, 2 sprays per nostril daily as needed. Proper nasal spray technique has been discussed and demonstrated.  Nasal saline lavage (NeilMed) as needed has been recommended along with instructions for proper administration.  Guaifenesin 1200 mg (plus/minus pseudoephedrine 120 mg) twice daily as needed with adequate hydration. Pseudoephedrine is only to be used for short-term relief of nasal/sinus congestion. Long-term use is discouraged due to potential side effects.   The patient has been asked to contact me if her symptoms persist, progress, or if she becomes febrile. Otherwise, she may return for follow up in 4 months.  Cough The patient's history and physical examination suggest upper airway cough syndrome secondary to sinusitis.  Spirometry today reveals normal ventilatory function.  Treatment plan as outlined above.  If the coughing persists or progresses despite this plan, we will evaluate further.  Diabetes  Angelica Carroll has been  asked to carefully monitor blood glucose levels while on prednisone.  She has verbalized understanding and has agreed to do so.    Meds ordered this encounter  Medications  . methylPREDNISolone acetate (DEPO-MEDROL) injection 80 mg    Sig:   . fluticasone (FLONASE) 50 MCG/ACT nasal spray    Sig: Place 2 sprays into both nostrils daily.    Dispense:  1 g    Refill:  5  . azithromycin (ZITHROMAX Z-PAK) 250 MG tablet    Sig: Take 2 tablets today then one tablet daily from day 2-5    Dispense:  6 each    Refill:  0    Diagnositics: Spirometry:  Normal with an FEV1 of 96% predicted.  Please see scanned spirometry results for details.    Physical examination: Blood pressure 134/88, pulse 92, temperature 98 F (36.7 C), temperature source Oral, resp. rate 20, height 5' 6.14" (1.68 m), weight 225 lb 8.5 oz (102.3 kg), last menstrual period 10/11/2015.  General: Alert, interactive, in no acute distress. HEENT: TMs pearly gray, turbinates edematous with thick discharge, post-pharynx erythematous. Neck: Supple without lymphadenopathy. Lungs: Clear to auscultation without wheezing, rhonchi or rales. CV: Normal S1, S2 without murmurs. Abdomen: Nondistended, nontender. Skin: Warm and dry, without lesions or rashes. Extremities:  No clubbing, cyanosis or edema. Neuro:   Grossly intact.  Review of systems: Review of Systems  Constitutional: Negative for fever, chills and weight loss.  HENT: Positive for congestion and ear pain. Negative for nosebleeds.   Eyes: Negative for blurred vision.  Respiratory: Positive for cough. Negative for hemoptysis and shortness of breath.   Cardiovascular: Negative for chest pain.  Gastrointestinal: Negative for diarrhea and constipation.  Genitourinary: Negative for dysuria.  Musculoskeletal:  Negative for myalgias and joint pain.  Skin: Negative for itching and rash.  Neurological: Negative for dizziness.  Endo/Heme/Allergies: Does not bruise/bleed  easily.    Past medical history: Past Medical History  Diagnosis Date  . Diabetes mellitus   . Obese   . Palpitations   . Hypertension   . PCOS (polycystic ovarian syndrome)   . PPD positive     took treatment antibiotic for 9 months, negative CXR  . GERD (gastroesophageal reflux disease)   . Headache     history of migraines  . Anemia     history of  . Back pain   . Complication of anesthesia     patient needed extra lidocaine during oral procedure  . PONV (postoperative nausea and vomiting)     strong family history of PONV  . Urticaria     Past surgical history: Past Surgical History  Procedure Laterality Date  . Wisdom tooth extraction      Family history: Family History  Problem Relation Age of Onset  . Urticaria Mother   . COPD Mother   . Emphysema Mother   . Allergic rhinitis Father   . Asthma Neg Hx   . Eczema Neg Hx   . Immunodeficiency Neg Hx   . COPD Brother   . Emphysema Brother     Social history: Social History   Social History  . Marital Status: Married    Spouse Name: N/A  . Number of Children: N/A  . Years of Education: N/A   Occupational History  . Not on file.   Social History Main Topics  . Smoking status: Never Smoker   . Smokeless tobacco: Never Used  . Alcohol Use: No  . Drug Use: No  . Sexual Activity: Yes    Birth Control/ Protection: Pill   Other Topics Concern  . Not on file   Social History Narrative   Environmental History: the patient lives in a 42 year old mobile home with carpeting throughout and central air/heat. She is a nonsmoker without pets.    Medication List       This list is accurate as of: 11/17/15  4:34 PM.  Always use your most recent med list.               amLODipine 10 MG tablet  Commonly known as:  NORVASC  Take 10 mg by mouth daily.     atorvastatin 10 MG tablet  Commonly known as:  LIPITOR  Take 10 mg by mouth daily.     azithromycin 250 MG tablet  Commonly known as:  ZITHROMAX  Z-PAK  Take 2 tablets today then one tablet daily from day 2-5     butalbital-acetaminophen-caffeine 50-325-40 MG tablet  Commonly known as:  FIORICET, ESGIC  Take 1 tablet by mouth every 6 (six) hours as needed for headache.     cetirizine 10 MG tablet  Commonly known as:  ZYRTEC  Take 10 mg by mouth daily.     cholecalciferol 1000 units tablet  Commonly known as:  VITAMIN D  Take 1,000 Units by mouth daily.     cyclobenzaprine 10 MG tablet  Commonly known as:  FLEXERIL  Take 10 mg by mouth 3 (three) times daily as needed for muscle spasms.     DAYQUIL PO  Take 30 mLs by mouth every 4 (four) hours.     fluticasone 50 MCG/ACT nasal spray  Commonly known as:  FLONASE  Place 2 sprays into both nostrils daily.  HYDROcodone-acetaminophen 5-325 MG tablet  Commonly known as:  NORCO/VICODIN  Take 1 tablet by mouth every 4 (four) hours as needed for moderate pain.     LARIN 1.5/30 1.5-30 MG-MCG tablet  Generic drug:  Norethindrone Acetate-Ethinyl Estradiol  Take 1 tablet by mouth daily.     metFORMIN 1000 MG tablet  Commonly known as:  GLUCOPHAGE  Take 1,000 mg by mouth 2 (two) times daily with a meal.     MULTIVITAMIN/IRON PO  Take 1 tablet by mouth daily.     naproxen sodium 220 MG tablet  Commonly known as:  ANAPROX  Take 440 mg by mouth 2 (two) times daily as needed (pain).     pantoprazole 40 MG tablet  Commonly known as:  PROTONIX  Take 40 mg by mouth daily.     pregabalin 75 MG capsule  Commonly known as:  LYRICA  Take 75 mg by mouth 2 (two) times daily.     traMADol 50 MG tablet  Commonly known as:  ULTRAM  Take 50 mg by mouth every 6 (six) hours as needed for moderate pain.     triamterene-hydrochlorothiazide 37.5-25 MG tablet  Commonly known as:  MAXZIDE-25  Take 1 tablet by mouth daily.        Known medication allergies: Allergies  Allergen Reactions  . Clindamycin Hcl     Rash   . Diclofenac     Sick on stomach   . Ace Inhibitors Cough     lisinopril  . Amoxicillin Hives and Rash    Has patient had a PCN reaction causing immediate rash, facial/tongue/throat swelling, SOB or lightheadedness with hypotension: no Has patient had a PCN reaction causing severe rash involving mucus membranes or skin necrosis no Has patient had a PCN reaction that required hospitalization no Has patient had a PCN reaction occurring within the last 10 years: no If all of the above answers are "NO", then may proceed with Cephalosporin use.     I appreciate the opportunity to take part in this [place]lee's care. Please do not hesitate to contact me with questions.  Sincerely,   R. Jorene Guest, MD

## 2015-11-17 NOTE — Assessment & Plan Note (Signed)
   Depo-Medrol 80 mg was administered in the office.  A prescription has been provided for azithromycin, 500 mg on day 1 and 250 mg on days 2 through 5.  A prescription has been provided for fluticasone nasal spray, 2 sprays per nostril daily as needed. Proper nasal spray technique has been discussed and demonstrated.  Nasal saline lavage (NeilMed) as needed has been recommended along with instructions for proper administration.  Guaifenesin 1200 mg (plus/minus pseudoephedrine 120 mg) twice daily as needed with adequate hydration. Pseudoephedrine is only to be used for short-term relief of nasal/sinus congestion. Long-term use is discouraged due to potential side effects.   The patient has been asked to contact me if her symptoms persist, progress, or if she becomes febrile. Otherwise, she may return for follow up in 4 months.

## 2015-11-17 NOTE — Assessment & Plan Note (Addendum)
   Angelica Carroll has been asked to carefully monitor blood glucose levels while on prednisone.  She has verbalized understanding and has agreed to do so.

## 2015-11-20 ENCOUNTER — Ambulatory Visit (HOSPITAL_COMMUNITY): Payer: 59 | Admitting: Anesthesiology

## 2015-11-20 ENCOUNTER — Ambulatory Visit (HOSPITAL_COMMUNITY)
Admission: RE | Admit: 2015-11-20 | Discharge: 2015-11-20 | Disposition: A | Discharge status: Home / Self Care | Payer: 59 | Source: Ambulatory Visit | Attending: Obstetrics & Gynecology | Admitting: Obstetrics & Gynecology

## 2015-11-20 ENCOUNTER — Encounter (HOSPITAL_COMMUNITY)
Admission: RE | Disposition: A | Discharge status: Home / Self Care | Payer: Self-pay | Source: Ambulatory Visit | Attending: Obstetrics & Gynecology

## 2015-11-20 ENCOUNTER — Encounter (HOSPITAL_COMMUNITY): Payer: Self-pay | Admitting: Registered Nurse

## 2015-11-20 DIAGNOSIS — N946 Dysmenorrhea, unspecified: Secondary | ICD-10-CM | POA: Insufficient documentation

## 2015-11-20 DIAGNOSIS — N939 Abnormal uterine and vaginal bleeding, unspecified: Secondary | ICD-10-CM | POA: Diagnosis not present

## 2015-11-20 DIAGNOSIS — I10 Essential (primary) hypertension: Secondary | ICD-10-CM | POA: Diagnosis not present

## 2015-11-20 DIAGNOSIS — E119 Type 2 diabetes mellitus without complications: Secondary | ICD-10-CM | POA: Insufficient documentation

## 2015-11-20 DIAGNOSIS — K219 Gastro-esophageal reflux disease without esophagitis: Secondary | ICD-10-CM | POA: Diagnosis not present

## 2015-11-20 DIAGNOSIS — E282 Polycystic ovarian syndrome: Secondary | ICD-10-CM | POA: Insufficient documentation

## 2015-11-20 DIAGNOSIS — N840 Polyp of corpus uteri: Secondary | ICD-10-CM | POA: Diagnosis not present

## 2015-11-20 DIAGNOSIS — N938 Other specified abnormal uterine and vaginal bleeding: Secondary | ICD-10-CM | POA: Insufficient documentation

## 2015-11-20 HISTORY — PX: DILITATION & CURRETTAGE/HYSTROSCOPY WITH HYDROTHERMAL ABLATION: SHX5570

## 2015-11-20 LAB — GLUCOSE, CAPILLARY
Glucose-Capillary: 128 mg/dL — ABNORMAL HIGH (ref 65–99)
Glucose-Capillary: 136 mg/dL — ABNORMAL HIGH (ref 65–99)

## 2015-11-20 LAB — PREGNANCY, URINE: Preg Test, Ur: NEGATIVE

## 2015-11-20 SURGERY — DILATATION & CURETTAGE/HYSTEROSCOPY WITH HYDROTHERMAL ABLATION
Anesthesia: General | Site: Vagina

## 2015-11-20 MED ORDER — ONDANSETRON HCL 4 MG/2ML IJ SOLN
INTRAMUSCULAR | Status: DC | PRN
  Administered 2015-11-20: 4 mg via INTRAVENOUS

## 2015-11-20 MED ORDER — SODIUM CHLORIDE 0.9 % IR SOLN
Status: DC | PRN
  Administered 2015-11-20: 3000 mL

## 2015-11-20 MED ORDER — FENTANYL CITRATE (PF) 100 MCG/2ML IJ SOLN
INTRAMUSCULAR | Status: AC
  Filled 2015-11-20: qty 2

## 2015-11-20 MED ORDER — LIDOCAINE HCL (CARDIAC) 20 MG/ML IV SOLN
INTRAVENOUS | Status: DC | PRN
Start: 2015-11-20 — End: 2015-11-20
  Administered 2015-11-20: 60 mg via INTRAVENOUS

## 2015-11-20 MED ORDER — SUCCINYLCHOLINE CHLORIDE 20 MG/ML IJ SOLN
INTRAMUSCULAR | Status: DC | PRN
  Administered 2015-11-20: 120 mg via INTRAVENOUS

## 2015-11-20 MED ORDER — LACTATED RINGERS IV SOLN
INTRAVENOUS | Status: DC
  Administered 2015-11-20 (×2): via INTRAVENOUS

## 2015-11-20 MED ORDER — OXYCODONE HCL 5 MG PO TABS
5.0000 mg | ORAL_TABLET | Freq: Once | ORAL | Status: AC | PRN
  Administered 2015-11-20: 5 mg via ORAL

## 2015-11-20 MED ORDER — HYDROMORPHONE HCL 1 MG/ML IJ SOLN
INTRAMUSCULAR | Status: AC
  Administered 2015-11-20: 0.5 mg via INTRAVENOUS
  Filled 2015-11-20: qty 1

## 2015-11-20 MED ORDER — LIDOCAINE HCL (CARDIAC) 20 MG/ML IV SOLN
INTRAVENOUS | Status: AC
  Filled 2015-11-20: qty 5

## 2015-11-20 MED ORDER — DEXAMETHASONE SODIUM PHOSPHATE 4 MG/ML IJ SOLN
INTRAMUSCULAR | Status: DC | PRN
  Administered 2015-11-20: 4 mg via INTRAVENOUS

## 2015-11-20 MED ORDER — SCOPOLAMINE 1 MG/3DAYS TD PT72
1.0000 | MEDICATED_PATCH | Freq: Once | TRANSDERMAL | Status: DC
  Administered 2015-11-20: 1.5 mg via TRANSDERMAL

## 2015-11-20 MED ORDER — ONDANSETRON HCL 4 MG/2ML IJ SOLN
INTRAMUSCULAR | Status: AC
Start: 2015-11-20 — End: 2015-11-20
  Filled 2015-11-20: qty 2

## 2015-11-20 MED ORDER — PROPOFOL 10 MG/ML IV BOLUS
INTRAVENOUS | Status: DC | PRN
  Administered 2015-11-20: 200 mg via INTRAVENOUS

## 2015-11-20 MED ORDER — OXYCODONE HCL 5 MG PO TABS
ORAL_TABLET | ORAL | Status: AC
  Filled 2015-11-20: qty 1

## 2015-11-20 MED ORDER — OXYCODONE HCL 5 MG/5ML PO SOLN: 5.0000 mg | Freq: Once | ORAL | Status: AC | PRN

## 2015-11-20 MED ORDER — FENTANYL CITRATE (PF) 100 MCG/2ML IJ SOLN
INTRAMUSCULAR | Status: DC | PRN
  Administered 2015-11-20 (×3): 50 ug via INTRAVENOUS
  Administered 2015-11-20 (×2): 25 ug via INTRAVENOUS

## 2015-11-20 MED ORDER — SCOPOLAMINE 1 MG/3DAYS TD PT72
MEDICATED_PATCH | TRANSDERMAL | Status: AC
  Administered 2015-11-20: 1.5 mg via TRANSDERMAL
  Filled 2015-11-20: qty 1

## 2015-11-20 MED ORDER — MIDAZOLAM HCL 5 MG/5ML IJ SOLN
INTRAMUSCULAR | Status: DC | PRN
  Administered 2015-11-20: 2 mg via INTRAVENOUS

## 2015-11-20 MED ORDER — LACTATED RINGERS IV SOLN: INTRAVENOUS | Status: DC

## 2015-11-20 MED ORDER — DEXAMETHASONE SODIUM PHOSPHATE 4 MG/ML IJ SOLN
INTRAMUSCULAR | Status: AC
  Filled 2015-11-20: qty 1

## 2015-11-20 MED ORDER — HYDROMORPHONE HCL 1 MG/ML IJ SOLN
0.2500 mg | INTRAMUSCULAR | Status: DC | PRN
  Administered 2015-11-20 (×2): 0.5 mg via INTRAVENOUS

## 2015-11-20 MED ORDER — MEPERIDINE HCL 25 MG/ML IJ SOLN: 6.2500 mg | INTRAMUSCULAR | Status: DC | PRN

## 2015-11-20 MED ORDER — PROMETHAZINE HCL 25 MG/ML IJ SOLN: 6.2500 mg | INTRAMUSCULAR | Status: DC | PRN

## 2015-11-20 MED ORDER — PROPOFOL 10 MG/ML IV BOLUS
INTRAVENOUS | Status: AC
  Filled 2015-11-20: qty 20

## 2015-11-20 MED ORDER — MIDAZOLAM HCL 2 MG/2ML IJ SOLN
INTRAMUSCULAR | Status: AC
  Filled 2015-11-20: qty 2

## 2015-11-20 MED ORDER — SUCCINYLCHOLINE CHLORIDE 20 MG/ML IJ SOLN
INTRAMUSCULAR | Status: AC
  Filled 2015-11-20: qty 1

## 2015-11-20 SURGICAL SUPPLY — 15 items
CANISTER SUCT 3000ML (MISCELLANEOUS) ×2 IMPLANT
CATH ROBINSON RED A/P 16FR (CATHETERS) ×2 IMPLANT
CLOTH BEACON ORANGE TIMEOUT ST (SAFETY) ×2 IMPLANT
CONTAINER PREFILL 10% NBF 60ML (FORM) ×4 IMPLANT
DILATOR CANAL MILEX (MISCELLANEOUS) IMPLANT
GLOVE BIOGEL PI IND STRL 6.5 (GLOVE) ×2 IMPLANT
GLOVE BIOGEL PI IND STRL 7.0 (GLOVE) ×1 IMPLANT
GLOVE BIOGEL PI INDICATOR 6.5 (GLOVE) ×2
GLOVE BIOGEL PI INDICATOR 7.0 (GLOVE) ×1
GLOVE ECLIPSE 6.5 STRL STRAW (GLOVE) ×2 IMPLANT
GOWN STRL REUS W/TWL LRG LVL3 (GOWN DISPOSABLE) ×4 IMPLANT
PACK VAGINAL MINOR WOMEN LF (CUSTOM PROCEDURE TRAY) ×2 IMPLANT
PAD OB MATERNITY 4.3X12.25 (PERSONAL CARE ITEMS) ×2 IMPLANT
SET GENESYS HTA PROCERVA (MISCELLANEOUS) ×2 IMPLANT
TOWEL OR 17X24 6PK STRL BLUE (TOWEL DISPOSABLE) ×4 IMPLANT

## 2015-11-20 NOTE — Anesthesia Procedure Notes (Signed)
Procedure Name: Intubation Performed by: Shanon Payor Pre-anesthesia Checklist: Patient identified, Emergency Drugs available, Suction available, Patient being monitored and Timeout performed Patient Re-evaluated:Patient Re-evaluated prior to inductionOxygen Delivery Method: Circle system utilized Preoxygenation: Pre-oxygenation with 100% oxygen Intubation Type: IV induction Ventilation: Mask ventilation without difficulty Laryngoscope Size: Mac and 3 Grade View: Grade II Tube size: 7.0 mm Number of attempts: 1 Airway Equipment and Method: Stylet and Patient positioned with wedge pillow Dental Injury: Dental damage  Comments: Patient with poor dentition. After intubation a portion of left canine tooth noted to be laying on patient's tongue when teeth were examined after tube placement. Piece of tooth removed and placed in specimen cup to give to patient.

## 2015-11-20 NOTE — Op Note (Signed)
Operative Report  PreOp: AUB, dysmenorrhea PostOp: same Procedure:  Hysteroscopy, Dilation and Curettage, Endometrial ablation Surgeon: Dr. Myna Hidalgo Anesthesia: General Complications:none EBL: 5mL UOP: 30cc IVF:1000cc  Findings:10cm anteverted uterus with proliferative endometrium  Specimens: 1) endometrial curettings  Procedure: The patient was taken to the operating room where she underwent general anesthesia without difficulty. The patient was placed in a low lithotomy position using Allen stirrups. The patient was examined with the findings as noted above.  She was then prepped and draped in the normal sterile fashion. The bladder was drained using a red rubber urethral catheter. A sterile speculum was inserted into the vagina. A single tooth tenaculum was placed on the anterior lip of the cervix. The uterus was then sounded to 10cm. The diagnostic hysteroscope was then inserted without difficulty and noted to have the findings as listed above. Attention was then turned to the HTA system. The HTA was set up according to manufacture instructions. Cavity assessment was performed and passed and the device was activated.   Global ablation was visualized and no uterine perforation was seen. Curettage was performed and sent to pathology.  All instrument were then removed. Hemostasis was observed at the cervical site using silver nitrate. The patient was repositioned to the supine position. The patient tolerated the procedure without any complications and taken to recovery in stable condition.   Myna Hidalgo, DO [phone] (pager) [phone] (office)

## 2015-11-20 NOTE — Anesthesia Postprocedure Evaluation (Signed)
Anesthesia Post Note  Patient: Angelica Carroll  Procedure(s) Performed: Procedure(s) (LRB): DILATATION & CURETTAGE/HYSTEROSCOPY WITH HYDROTHERMAL ABLATION (N/A)  Patient location during evaluation: PACU Anesthesia Type: General Level of consciousness: awake and alert Pain management: pain level controlled Vital Signs Assessment: post-procedure vital signs reviewed and stable Respiratory status: spontaneous breathing, nonlabored ventilation, respiratory function stable and patient connected to nasal cannula oxygen Cardiovascular status: blood pressure returned to baseline and stable Postop Assessment: no signs of nausea or vomiting Anesthetic complications: yes Anesthetic complication details: anesthesia complicationsComments: Patient with poor dentition prior to anesthesia. After intubation, piece of upper left front tooth observed on tongue. No other visible damage from intubation noted. Tooth piece placed in specimen cup and given to patient. Spoke with patient afterwards in the PACU and she was not concerned about this. She reports that her teeth have been bad for years and she is scheduled to have them all pulled. I instructed her that she could go see a dentist and contact the hospital if needed. She declined.     Last Vitals:  Filed Vitals:   11/20/15 1300 11/20/15 1315  BP:    Pulse:  87  Temp:    Resp: 16 16    Last Pain:  Filed Vitals:   11/20/15 1316  PainSc: 3                  Esaw Knippel JENNETTE

## 2015-11-20 NOTE — Discharge Instructions (Signed)
HOME INSTRUCTIONS  Please note any unusual or excessive bleeding, pain, swelling. Mild dizziness or drowsiness are normal for about 24 hours after surgery.   Shower when comfortable  Restrictions: No driving for 24 hours or while taking pain medications.  Activity:  No heavy lifting (> 10 lbs), nothing in vagina (no tampons, douching, or intercourse) x 4 weeks; no tub baths for 4 weeks.  Vaginal spotting is expected but if your bleeding is heavy, period like,  please call the office  Diet:  You may return to your regular diet.  Do not eat large meals.  Eat small frequent meals throughout the day.  Continue to drink a good amount of water at least 6-8 glasses of water per day, hydration is very important for the healing process.  Pain Management: Take Motrin and/or Tylenol as needed for pain.  Always take prescription pain medication with food, it may cause constipation, increase fluids and fiber and you may want to take an over-the-counter stool softener like Colace as needed up to 2x a day.    Alcohol -- Avoid for 24 hours and while taking pain medications.  Nausea: Take sips of ginger ale or soda  Fever -- Call physician if temperature over 101 degrees  Follow up:  If you do not already have a follow up appointment scheduled, please call the office at [phone].  If you experience fever (a temperature greater than 100.4), pain unrelieved by pain medication, shortness of breath, swelling of a single leg, or any other symptoms which are concerning to you please the office immediately.

## 2015-11-20 NOTE — Transfer of Care (Signed)
Immediate Anesthesia Transfer of Care Note  Patient: Angelica Carroll  Procedure(s) Performed: Procedure(s): DILATATION & CURETTAGE/HYSTEROSCOPY WITH HYDROTHERMAL ABLATION (N/A)  Patient Location: PACU  Anesthesia Type:General  Level of Consciousness: awake, alert  and oriented  Airway & Oxygen Therapy: Patient Spontanous Breathing and Patient connected to nasal cannula oxygen  Post-op Assessment: Report given to RN  Post vital signs: Reviewed  Last Vitals:  Filed Vitals:   11/20/15 0956  BP: 144/98  Pulse: 89  Temp: 37.1 C  Resp: 20    Complications: No apparent anesthesia complications

## 2015-11-20 NOTE — Interval H&P Note (Signed)
History and Physical Interval Note:  11/20/2015 11:18 AM  Angelica Carroll  has presented today for surgery, with the diagnosis of  AUB  Dysmenorrhea  The various methods of treatment have been discussed with the patient and family. After consideration of risks, benefits and other options for treatment, the patient has consented to  Procedure(s): DILATATION & CURETTAGE/HYSTEROSCOPY WITH HYDROTHERMAL ABLATION (N/A) as a surgical intervention .  The patient's history has been reviewed, patient examined, no change in status, stable for surgery.  I have reviewed the patient's chart and labs.  Questions were answered to the patient's satisfaction.     Myna Hidalgo, M

## 2015-11-20 NOTE — Anesthesia Preprocedure Evaluation (Addendum)
Anesthesia Evaluation  Patient identified by MRN, date of birth, ID band Patient awake    Reviewed: Allergy & Precautions, NPO status , Patient's Chart, lab work & pertinent test results  History of Anesthesia Complications (+) PONV and history of anesthetic complications  Airway Mallampati: II  TM Distance: >3 FB Neck ROM: Full    Dental  (+) Dental Advisory Given, Chipped, Poor Dentition   Pulmonary neg pulmonary ROS,    Pulmonary exam normal breath sounds clear to auscultation       Cardiovascular hypertension, Pt. on medications Normal cardiovascular exam Rhythm:Regular Rate:Normal     Neuro/Psych  Headaches, negative psych ROS   GI/Hepatic Neg liver ROS, GERD  ,  Endo/Other  diabetes, Type 2  Renal/GU negative Renal ROS     Musculoskeletal negative musculoskeletal ROS (+)   Abdominal   Peds  Hematology negative hematology ROS (+) anemia ,   Anesthesia Other Findings   Reproductive/Obstetrics negative OB ROS                           Anesthesia Physical Anesthesia Plan  ASA: II  Anesthesia Plan: General   Post-op Pain Management:    Induction: Intravenous  Airway Management Planned: Oral ETT  Additional Equipment:   Intra-op Plan:   Post-operative Plan: Extubation in OR  Informed Consent: I have reviewed the patients History and Physical, chart, labs and discussed the procedure including the risks, benefits and alternatives for the proposed anesthesia with the patient or authorized representative who has indicated his/her understanding and acceptance.   Dental advisory given  Plan Discussed with: CRNA  Anesthesia Plan Comments:       Anesthesia Quick Evaluation

## 2015-11-21 ENCOUNTER — Encounter (HOSPITAL_COMMUNITY): Payer: Self-pay | Admitting: Obstetrics & Gynecology

## 2015-11-25 MED FILL — FLUTICASONE PROP 50 MCG SPR: 50 | 30 days supply | Qty: 16 | Fill #0

## 2015-12-01 MED FILL — LYRICA 75 MG CAPSULE: 75 | 30 days supply | Qty: 60 | Fill #1

## 2015-12-02 DIAGNOSIS — R32 Unspecified urinary incontinence: Secondary | ICD-10-CM | POA: Diagnosis not present

## 2015-12-02 DIAGNOSIS — Z09 Encounter for follow-up examination after completed treatment for conditions other than malignant neoplasm: Secondary | ICD-10-CM | POA: Diagnosis not present

## 2015-12-03 MED FILL — MYRBETRIQ ER 25 MG TABLET: 25 | 30 days supply | Qty: 30 | Fill #0

## 2015-12-26 MED FILL — LYRICA 75 MG CAPSULE: 75 | 30 days supply | Qty: 60 | Fill #0

## 2015-12-26 MED FILL — HYDROCODON-APAP 5-325: 5-325 | 15 days supply | Qty: 30 | Fill #0

## 2015-12-31 DIAGNOSIS — N3942 Incontinence without sensory awareness: Secondary | ICD-10-CM | POA: Diagnosis not present

## 2015-12-31 DIAGNOSIS — N390 Urinary tract infection, site not specified: Secondary | ICD-10-CM | POA: Diagnosis not present

## 2015-12-31 DIAGNOSIS — R339 Retention of urine, unspecified: Secondary | ICD-10-CM | POA: Diagnosis not present

## 2015-12-31 DIAGNOSIS — R32 Unspecified urinary incontinence: Secondary | ICD-10-CM | POA: Diagnosis not present

## 2016-01-28 MED FILL — PANTOPRAZOLE SOD DR 40 MG T: 40 | 90 days supply | Qty: 90 | Fill #0

## 2016-01-28 MED FILL — LYRICA 75 MG CAPSULE: 75 | 30 days supply | Qty: 60 | Fill #1

## 2016-01-28 MED FILL — metFORMIN HCL 1000 MG TABS: 1000 | 90 days supply | Qty: 180 | Fill #0

## 2016-01-30 MED FILL — LARIN 1.5 MG-30 MCG TABLET: 1.5-30 | 84 days supply | Qty: 63 | Fill #3

## 2016-02-02 ENCOUNTER — Ambulatory Visit (INDEPENDENT_AMBULATORY_CARE_PROVIDER_SITE_OTHER): Payer: 59 | Admitting: Internal Medicine

## 2016-02-02 ENCOUNTER — Encounter: Payer: Self-pay | Admitting: Internal Medicine

## 2016-02-02 VITALS — BP 136/86 | HR 80 | Temp 98.0°F | Resp 20 | Ht 66.54 in | Wt 230.2 lb

## 2016-02-02 DIAGNOSIS — J019 Acute sinusitis, unspecified: Secondary | ICD-10-CM | POA: Diagnosis not present

## 2016-02-02 DIAGNOSIS — R05 Cough: Secondary | ICD-10-CM | POA: Diagnosis not present

## 2016-02-02 DIAGNOSIS — R059 Cough, unspecified: Secondary | ICD-10-CM

## 2016-02-02 MED ORDER — METHYLPREDNISOLONE ACETATE 80 MG/ML IJ SUSP
80.0000 mg | Freq: Once | INTRAMUSCULAR | Status: AC
  Administered 2016-02-02: 80 mg via INTRAMUSCULAR

## 2016-02-02 MED ORDER — AZITHROMYCIN 250 MG PO TABS: ORAL_TABLET | ORAL | Status: DC

## 2016-02-02 MED FILL — AZITHROMYCIN 250 MG TABLET: 250 | 5 days supply | Qty: 6 | Fill #0

## 2016-02-02 NOTE — Assessment & Plan Note (Signed)
   Intermittent, currently not well controlled due to infection  Antibiotics as above

## 2016-02-02 NOTE — Patient Instructions (Addendum)
Acute sinusitis  Given a Z-Pak  Given Depo-Medrol 80 mg IM  Continue cetirizine 10 mg daily and fluticasone 2 sprays each nostril daily  Check a zone 3 allergy panel for environmental hypersensitivities  Cough  Intermittent, currently not well controlled due to infection  Antibiotics as above

## 2016-02-02 NOTE — Assessment & Plan Note (Addendum)
   Given a Z-Pak  Given Depo-Medrol 80 mg IM  Continue cetirizine 10 mg daily and fluticasone 2 sprays each nostril daily  Check a zone 3 allergy panel for environmental hypersensitivities

## 2016-02-02 NOTE — Progress Notes (Signed)
History of Present Illness: Angelica Carroll is a 40 y.o. female presenting for a sick visit  HPI Comments: Chronic rhinitis: Patient has symptoms on a perennial basis and has several sinus infections a year requiring antibiotics. She has never had allergy testing for. She takes Zyrtec with as needed fluticasone but continues to have breakthrough symptoms. 4 days ago, she developed a cough productive of yellow mucus along with postnasal drainage and ear pressure. At last visit, she was treated with azithromycin and Depo-Medrol with improvement in her symptoms. She avoids prednisone due to the change  Cough: Patient has symptoms with rhinitis as above. She is not on any inhalers. She has not had any acute exacerbations requiring trips to the emergency room   Assessment and Plan: Acute sinusitis  Given a Z-Pak  Given Depo-Medrol 80 mg IM  Continue cetirizine 10 mg daily and fluticasone 2 sprays each nostril daily  Check a zone 3 allergy panel for environmental hypersensitivities  Cough  Intermittent, currently not well controlled due to infection  Antibiotics as above   Return in about 6 months (around 08/04/2016).  Current Outpatient Prescriptions on File Prior to Visit  Medication Sig Dispense Refill  . amLODipine (NORVASC) 10 MG tablet Take 10 mg by mouth daily.    Marland Kitchen atorvastatin (LIPITOR) 10 MG tablet Take 10 mg by mouth daily.    . butalbital-acetaminophen-caffeine (FIORICET, ESGIC) 50-325-40 MG per tablet Take 1 tablet by mouth every 6 (six) hours as needed for headache. 14 tablet 0  . cetirizine (ZYRTEC) 10 MG tablet Take 10 mg by mouth daily.    . cholecalciferol (VITAMIN D) 1000 UNITS tablet Take 1,000 Units by mouth daily.     . cyclobenzaprine (FLEXERIL) 10 MG tablet Take 10 mg by mouth 3 (three) times daily as needed for muscle spasms.    . fluticasone (FLONASE) 50 MCG/ACT nasal spray Place 2 sprays into both nostrils daily. 1 g 5  . HYDROcodone-acetaminophen (NORCO/VICODIN)  5-325 MG tablet Take 1 tablet by mouth every 4 (four) hours as needed for moderate pain.   0  . LARIN 1.5/30 1.5-30 MG-MCG tablet Take 1 tablet by mouth daily.  11  . metFORMIN (GLUCOPHAGE) 1000 MG tablet Take 1,000 mg by mouth 2 (two) times daily with a meal.     . Multiple Vitamins-Iron (MULTIVITAMIN/IRON PO) Take 1 tablet by mouth daily.    . naproxen sodium (ANAPROX) 220 MG tablet Take 440 mg by mouth 2 (two) times daily as needed (pain).    . pantoprazole (PROTONIX) 40 MG tablet Take 40 mg by mouth daily.    . pregabalin (LYRICA) 75 MG capsule Take 75 mg by mouth 2 (two) times daily.    . Pseudoephedrine-APAP-DM (DAYQUIL PO) Take 30 mLs by mouth every 4 (four) hours.     . traMADol (ULTRAM) 50 MG tablet Take 50 mg by mouth every 6 (six) hours as needed for moderate pain.    Marland Kitchen triamterene-hydrochlorothiazide (MAXZIDE-25) 37.5-25 MG per tablet Take 1 tablet by mouth daily.      No current facility-administered medications on file prior to visit.    Meds ordered this encounter  Medications  . methylPREDNISolone acetate (DEPO-MEDROL) injection 80 mg    Sig:   . azithromycin (ZITHROMAX) 250 MG tablet    Sig: Take 2 tablets by mouth today then 1 tablet by mouth day 2 thru 5.    Dispense:  6 each    Refill:  0    For infection  Diagnostics: Spirometry: FEV1 2.89 L or 88 %, FEV1/FVC  83 %.  This is a normal study  Physical Exam: BP 136/86 mmHg  Pulse 80  Temp(Src) 98 F (36.7 C) (Oral)  Resp 20  Ht 5' 6.53" (1.69 m)  Wt 230 lb 2.6 oz (104.4 kg)  BMI 36.55 kg/m2   Physical Exam  Constitutional: She appears well-developed and well-nourished. No distress.  HENT:  Right Ear: External ear normal.  Left Ear: External ear normal.  Nose: Nose normal.  Mouth/Throat: Oropharynx is clear and moist.  Eyes: Conjunctivae are normal. Right eye exhibits no discharge. Left eye exhibits no discharge.  Cardiovascular: Normal rate, regular rhythm and normal heart sounds.   No murmur  heard. Pulmonary/Chest: Effort normal and breath sounds normal. No respiratory distress. She has no wheezes. She has no rales.  Abdominal: Soft. Bowel sounds are normal.  Musculoskeletal: She exhibits no edema.  Lymphadenopathy:    She has no cervical adenopathy.  Neurological: She is alert.  Skin: No rash noted.  Vitals reviewed.   Patient Active Problem List   Diagnosis Date Noted  . Acute sinusitis 11/17/2015  . Cough 11/17/2015  . Diabetes (HCC) 07/16/2014    Drug Allergies:  Allergies  Allergen Reactions  . Clindamycin Hcl     Rash   . Diclofenac     Sick on stomach   . Ace Inhibitors Cough    lisinopril  . Amoxicillin Hives and Rash    Has patient had a PCN reaction causing immediate rash, facial/tongue/throat swelling, SOB or lightheadedness with hypotension: no Has patient had a PCN reaction causing severe rash involving mucus membranes or skin necrosis no Has patient had a PCN reaction that required hospitalization no Has patient had a PCN reaction occurring within the last 10 years: no If all of the above answers are "NO", then may proceed with Cephalosporin use.     ROS: Per HPI unless specifically indicated below Review of Systems  Thank you for the opportunity to care for this patient.  Please do not hesitate to contact me with questions.

## 2016-02-09 ENCOUNTER — Other Ambulatory Visit: Payer: Self-pay | Admitting: Obstetrics & Gynecology

## 2016-02-09 DIAGNOSIS — N6320 Unspecified lump in the left breast, unspecified quadrant: Secondary | ICD-10-CM

## 2016-02-18 MED FILL — ATORVASTATIN 10 MG TABLET: 10 | 90 days supply | Qty: 90 | Fill #1

## 2016-02-18 MED FILL — AMLODIPINE BESYLATE 10 MG T: 10 | 90 days supply | Qty: 90 | Fill #0

## 2016-02-20 MED FILL — TRUE METRIX GLUCOSE TEST ST: 90 days supply | Qty: 100 | Fill #0 | Status: TO

## 2016-02-20 MED FILL — TRUEplus LANCETS 30G MISC: 90 days supply | Qty: 100 | Fill #0

## 2016-02-20 MED FILL — TRUE METRIX BLOOD GLUCOSE M: W/DEVICE | 1 days supply | Qty: 1 | Fill #0

## 2016-02-24 ENCOUNTER — Other Ambulatory Visit (HOSPITAL_COMMUNITY): Payer: Self-pay | Admitting: Physical Medicine and Rehabilitation

## 2016-02-24 DIAGNOSIS — M546 Pain in thoracic spine: Secondary | ICD-10-CM

## 2016-03-03 ENCOUNTER — Ambulatory Visit (HOSPITAL_COMMUNITY)
Admission: RE | Admit: 2016-03-03 | Discharge: 2016-03-03 | Disposition: A | Discharge status: Home / Self Care | Payer: PRIVATE HEALTH INSURANCE | Source: Ambulatory Visit | Attending: Physical Medicine and Rehabilitation | Admitting: Physical Medicine and Rehabilitation

## 2016-03-03 DIAGNOSIS — M899 Disorder of bone, unspecified: Secondary | ICD-10-CM | POA: Insufficient documentation

## 2016-03-03 DIAGNOSIS — M546 Pain in thoracic spine: Secondary | ICD-10-CM | POA: Insufficient documentation

## 2016-03-10 DIAGNOSIS — R32 Unspecified urinary incontinence: Secondary | ICD-10-CM | POA: Diagnosis not present

## 2016-03-10 DIAGNOSIS — N870 Mild cervical dysplasia: Secondary | ICD-10-CM | POA: Diagnosis not present

## 2016-03-10 DIAGNOSIS — R232 Flushing: Secondary | ICD-10-CM | POA: Diagnosis not present

## 2016-03-10 DIAGNOSIS — Z6837 Body mass index (BMI) 37.0-37.9, adult: Secondary | ICD-10-CM | POA: Diagnosis not present

## 2016-03-10 DIAGNOSIS — N939 Abnormal uterine and vaginal bleeding, unspecified: Secondary | ICD-10-CM | POA: Diagnosis not present

## 2016-03-10 DIAGNOSIS — I10 Essential (primary) hypertension: Secondary | ICD-10-CM | POA: Diagnosis not present

## 2016-03-10 DIAGNOSIS — E669 Obesity, unspecified: Secondary | ICD-10-CM | POA: Diagnosis not present

## 2016-03-16 MED FILL — TRIAMTERENE/HCTZ 37.5/25 TB: 37.5-25 | 90 days supply | Qty: 90 | Fill #0

## 2016-03-19 MED FILL — LYRICA 75 MG CAPSULE: 75 | 30 days supply | Qty: 60 | Fill #0

## 2016-03-19 MED FILL — HYDROCODON-APAP 5-325: 5-325 | 2 days supply | Qty: 20 | Fill #0

## 2016-03-19 MED FILL — TRIAZOLAM 0.25 MG TABLET: 0.25 | 1 days supply | Qty: 1 | Fill #0

## 2016-04-05 ENCOUNTER — Other Ambulatory Visit (HOSPITAL_COMMUNITY): Payer: Self-pay | Admitting: Physical Medicine and Rehabilitation

## 2016-04-05 DIAGNOSIS — M545 Low back pain: Secondary | ICD-10-CM

## 2016-04-09 MED FILL — TRIAZOLAM 0.125 MG TABLET: 0.125 | 1 days supply | Qty: 2 | Fill #0

## 2016-04-09 MED FILL — HYDROCODON-APAP 5-325: 5-325 | 3 days supply | Qty: 20 | Fill #0

## 2016-04-09 MED FILL — CLINDAMYCIN HCL 150 MG CAP: 150 | 7 days supply | Qty: 28 | Fill #0

## 2016-04-16 ENCOUNTER — Ambulatory Visit (HOSPITAL_COMMUNITY)
Admission: RE | Admit: 2016-04-16 | Discharge: 2016-04-16 | Disposition: A | Discharge status: Home / Self Care | Payer: PRIVATE HEALTH INSURANCE | Source: Ambulatory Visit | Attending: Physical Medicine and Rehabilitation | Admitting: Physical Medicine and Rehabilitation

## 2016-04-16 DIAGNOSIS — M5136 Other intervertebral disc degeneration, lumbar region: Secondary | ICD-10-CM | POA: Diagnosis not present

## 2016-04-16 DIAGNOSIS — M545 Low back pain: Secondary | ICD-10-CM | POA: Insufficient documentation

## 2016-04-16 DIAGNOSIS — M899 Disorder of bone, unspecified: Secondary | ICD-10-CM | POA: Diagnosis not present

## 2016-04-16 LAB — CREATININE, SERUM: Creatinine, Ser: 0.7 mg/dL (ref 0.44–1.00)

## 2016-04-16 MED ORDER — GADOBENATE DIMEGLUMINE 529 MG/ML IV SOLN
20.0000 mL | Freq: Once | INTRAVENOUS | Status: AC | PRN
  Administered 2016-04-16: 20 mL via INTRAVENOUS

## 2016-04-21 ENCOUNTER — Ambulatory Visit
Admission: RE | Admit: 2016-04-21 | Discharge: 2016-04-21 | Disposition: A | Discharge status: Home / Self Care | Payer: PRIVATE HEALTH INSURANCE | Source: Ambulatory Visit | Attending: Obstetrics & Gynecology | Admitting: Obstetrics & Gynecology

## 2016-04-21 ENCOUNTER — Other Ambulatory Visit: Payer: Self-pay

## 2016-04-21 DIAGNOSIS — N6320 Unspecified lump in the left breast, unspecified quadrant: Secondary | ICD-10-CM

## 2016-04-26 MED FILL — LYRICA 75 MG CAPSULE: 75 | 30 days supply | Qty: 60 | Fill #1

## 2016-04-26 MED FILL — metFORMIN HCL 1000 MG TABS: 1000 | 90 days supply | Qty: 180 | Fill #1

## 2016-04-26 MED FILL — LARIN 1.5 MG-30 MCG TABLET: 1.5-30 | 56 days supply | Qty: 42 | Fill #4

## 2016-04-26 MED FILL — PANTOPRAZOLE SOD DR 40 MG T: 40 | 90 days supply | Qty: 90 | Fill #0

## 2016-04-29 MED FILL — CYCLOBENZAPRINE 10 MG TAB: 10 | 30 days supply | Qty: 90 | Fill #0

## 2016-05-21 DIAGNOSIS — K219 Gastro-esophageal reflux disease without esophagitis: Secondary | ICD-10-CM | POA: Diagnosis not present

## 2016-05-21 DIAGNOSIS — B379 Candidiasis, unspecified: Secondary | ICD-10-CM | POA: Diagnosis not present

## 2016-05-21 DIAGNOSIS — E785 Hyperlipidemia, unspecified: Secondary | ICD-10-CM | POA: Diagnosis not present

## 2016-05-21 DIAGNOSIS — I10 Essential (primary) hypertension: Secondary | ICD-10-CM | POA: Diagnosis not present

## 2016-05-21 DIAGNOSIS — E119 Type 2 diabetes mellitus without complications: Secondary | ICD-10-CM | POA: Diagnosis not present

## 2016-05-21 DIAGNOSIS — Z7984 Long term (current) use of oral hypoglycemic drugs: Secondary | ICD-10-CM | POA: Diagnosis not present

## 2016-05-21 MED FILL — NYSTATIN 100,000 UNIT/GM CR: 100000 | 15 days supply | Qty: 30 | Fill #0

## 2016-05-31 MED FILL — ATORVASTATIN 10 MG TABLET: 10 | 90 days supply | Qty: 90 | Fill #0

## 2016-05-31 MED FILL — AMLODIPINE BESYLATE 10 MG T: 10 | 90 days supply | Qty: 90 | Fill #1

## 2016-05-31 MED FILL — TRIAMCINOLONE 0.1% CREAM: 0.1 | 30 days supply | Qty: 80 | Fill #0

## 2016-06-09 MED FILL — LARIN 1.5 MG-30 MCG TABLET: 1.5-30 | 84 days supply | Qty: 63 | Fill #0

## 2016-06-09 MED FILL — LYRICA 75 MG CAPSULE: 75 | 30 days supply | Qty: 60 | Fill #0

## 2016-06-11 MED FILL — TRIAMTERENE-HCTZ 37.5-25 MG: 37.5-25 | 90 days supply | Qty: 90 | Fill #0

## 2016-06-11 MED FILL — TRUE METRIX GLUCOSE TEST ST: 90 days supply | Qty: 100 | Fill #0

## 2016-06-17 ENCOUNTER — Encounter: Payer: Self-pay | Admitting: Allergy & Immunology

## 2016-06-17 ENCOUNTER — Ambulatory Visit (INDEPENDENT_AMBULATORY_CARE_PROVIDER_SITE_OTHER): Payer: 59 | Admitting: Allergy & Immunology

## 2016-06-17 ENCOUNTER — Encounter (INDEPENDENT_AMBULATORY_CARE_PROVIDER_SITE_OTHER): Payer: Self-pay

## 2016-06-17 VITALS — BP 130/78 | HR 76 | Temp 98.2°F | Resp 16

## 2016-06-17 DIAGNOSIS — B999 Unspecified infectious disease: Secondary | ICD-10-CM

## 2016-06-17 DIAGNOSIS — J019 Acute sinusitis, unspecified: Secondary | ICD-10-CM | POA: Diagnosis not present

## 2016-06-17 DIAGNOSIS — R0602 Shortness of breath: Secondary | ICD-10-CM | POA: Diagnosis not present

## 2016-06-17 MED ORDER — DOXYCYCLINE HYCLATE 100 MG PO CAPS: ORAL_CAPSULE | ORAL | 0 refills | Status: DC

## 2016-06-17 MED ORDER — METHYLPREDNISOLONE ACETATE 80 MG/ML IJ SUSP
80.0000 mg | Freq: Once | INTRAMUSCULAR | Status: AC
Start: 2016-06-17 — End: 2016-06-17
  Administered 2016-06-17: 80 mg via INTRAMUSCULAR

## 2016-06-17 MED FILL — DOXYCYCLINE HYCLATE 100 MG: 100 | 10 days supply | Qty: 20 | Fill #0

## 2016-06-17 NOTE — Progress Notes (Signed)
FOLLOW UP  Date of Service/Encounter:  06/17/16   Assessment:   Acute sinusitis, recurrence not specified, unspecified location - Plan: Spirometry with Graph  Recurrent infections   Plan/Recommendations:    1. Acute sinusitis, recurrence not specified, unspecified location - We gave you DepoMedrol 80mg  in clinic today.  - This should help open things up.  - Start doxycycline 100mg  twice daily for ten days. - I would like to get allergy testing at your next visit. - The history of recurrent sinusitis is concerning for uncontrolled allergic rhinitis.   2. Recurrent infections - Since this is your third sinus infection this year, we may consider screening labs. - If this continues, we will plan to get screening labs to rule out an immunodeficiency.  3. Return in about 3 months (around 09/16/2016).    Subjective:   Angelica Carroll is a 40 y.o. female presenting today for follow up of  Chief Complaint  Patient presents with  . Nasal Congestion    Increased congestion and sore throat that started on Sunday.   . Cough    Cough x 4 days.  Productive cough this morning.  Angelica Carroll has a history of the following: Patient Active Problem List   Diagnosis Date Noted  . Acute sinusitis 11/17/2015  . Cough 11/17/2015  . Diabetes (HCC) 07/16/2014    History obtained from: chart review and patient.  Angelica Carroll was referred by Angelica Soho, PA-C.     Angelica Carroll is a 40 y.o. female presenting for a follow up visit for a sinus infection. She started having a scratchy sore throat on Sunday. Then on Monday she had a sore throat all day. Then she developed head and sinus pressure with nasal discharge on Tuesday. During this time, she has had worsening cough. She did not have fevers. She does not have a history of asthma. The last time that she sick she did have some wheezing but the spiro was normal.  Angelica Carroll did live in [place] for several years but moved to [place] five  years ago. In [place], she was "sick all of the time" with constant nasal congestion and sinus pressure. Since moving to [facility], she has been much better aside from this past calendar year. She has had now 3 sinus infections in the last 4 months. Each of the other times she was treated with azithromycin, which she does not feel helped very much. She has never been allergy tested. She did start the Qnasl two days ago and she takes cetirizine every day otherwise she feels "terrible". She was on Rhinocort which did not seem to work (sometime earlier this year). Her symptoms remain the same throughout the year. There are no pets at home. She does have hives with cats and has worsening congestions and rhinorrhea with eye itching when she is around cats.   The patient does have a history of diabetes mellitus controlled the metformin as well as hypertension. She was at home with her husband. She also has a 35-year-old stepson who does spend time with the few times a week. Otherwise, there have been no changes to the past medical history, surgical history, family history, or social history.     Review of Systems: a 14-point review of systems is pertinent for what is mentioned in HPI.  Otherwise, all other systems were negative. Constitutional: negative other than that listed in the HPI Eyes: negative other than that listed in the HPI Ears, nose, mouth, throat, and  face: negative other than that listed in the HPI Respiratory: negative other than that listed in the HPI Cardiovascular: negative other than that listed in the HPI Gastrointestinal: negative other than that listed in the HPI Genitourinary: negative other than that listed in the HPI Integument: negative other than that listed in the HPI Hematologic: negative other than that listed in the HPI Musculoskeletal: negative other than that listed in the HPI Neurological: negative other than that listed in the HPI Allergy/Immunologic: negative other  than that listed in the HPI    Objective:   Blood pressure 130/78, pulse 76, temperature 98.2 F (36.8 C), temperature source Oral, resp. rate 16, SpO2 98 %. There is no height or weight on file to calculate BMI.   Physical Exam:  General: Alert, interactive, in no acute distress. Pleasant cooperative female. HEENT: TMs pearly gray, turbinates edematous with thick discharge, post-pharynx erythematous. Bilateral maxillary sinus tenderness present. Cobblestoning appearance in the posterior oropharynx. Neck: Supple without thyromegaly. Adenopathy: no enlarged lymph nodes appreciated in the anterior cervical, occipital, axillary, epitrochlear, inguinal, or popliteal regions Lungs: Clear to auscultation without wheezing, rhonchi or rales. No increased work of breathing. CV: Normal S1, S2 without murmurs. Capillary refill <2 seconds.  Skin: Warm and dry, without lesions or rashes. Extremities:  No clubbing, cyanosis or edema. Neuro:   Grossly intact.   Diagnostic studies:  Spirometry: results normal (FEV1: 2.86/95%, FVC: 3.26/90%, FEV1/FVC: 87%).    Spirometry consistent with normal pattern   Allergy Studies: None    Angelica BondsJoel Taylie Helder, MD [facility]

## 2016-06-17 NOTE — Patient Instructions (Addendum)
1. Acute sinusitis, recurrence not specified, unspecified location - We gave you depomedrol in clinic today.  - This should help open things up.  - Start doxycycline 100mg  twice daily for ten days. - I would like to get allergy testing at your next visit.  2. Recurrent infections - Since this is your third sinus infection this year, we may consider screening labs. - If this continues, we will plan to get screening labs to rule out an immunodeficiency.  3. Return in about 3 months (around 09/16/2016).  Please inform us of any Emergency Department visits, hospitalizations, or changes in symptoms. Call us before going to the ED for breathing or allergy symptoms since we might be able to fit you in for a sick visit. Feel free to contact us anytime with any questions, problems, or concerns.  It was a pleasure to see you again today!

## 2016-06-25 MED FILL — PANTOPRAZOLE SOD DR 40 MG T: 40 | 90 days supply | Qty: 90 | Fill #0

## 2016-07-07 ENCOUNTER — Encounter: Payer: Self-pay | Admitting: Allergy & Immunology

## 2016-07-07 ENCOUNTER — Ambulatory Visit (INDEPENDENT_AMBULATORY_CARE_PROVIDER_SITE_OTHER): Payer: 59 | Admitting: Allergy & Immunology

## 2016-07-07 VITALS — BP 130/78 | HR 84 | Resp 16

## 2016-07-07 DIAGNOSIS — B999 Unspecified infectious disease: Secondary | ICD-10-CM

## 2016-07-07 DIAGNOSIS — J310 Chronic rhinitis: Secondary | ICD-10-CM | POA: Diagnosis not present

## 2016-07-07 DIAGNOSIS — T781XXD Other adverse food reactions, not elsewhere classified, subsequent encounter: Secondary | ICD-10-CM | POA: Diagnosis not present

## 2016-07-07 MED ORDER — BECLOMETHASONE DIPROPIONATE 80 MCG/ACT NA AERS: 2.0000 | INHALATION_SPRAY | Freq: Every day | NASAL | 12 refills | Status: AC

## 2016-07-07 NOTE — Addendum Note (Signed)
Addended by: Alfonse SpruceGALLAGHER, Lucresha Dismuke LOUIS on: 07/07/2016 04:55 PM   Modules accepted: Orders

## 2016-07-07 NOTE — Patient Instructions (Addendum)
1. Other chronic rhinitis - Testing was positive for grasses, weeds, ragweed, trees, all mold mixes, dust mites, cat, dog, and cockroach - Continue with Qnasl one spray per nostril daily. - Continue with cetirizine 10mg  daily.  - Discussed starting allergy shots.  2. Adverse food reaction, subsequent encounter - Pineapple testing was negative. - Limit exposure as tolerated.  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   3. Recurrent infections - Hopefully controlling your nasal allergies will help decrease your occurrence of sinus infections. - Will defer immune testing for now.   4. Return in about 1 year (around 07/07/2017).  Please inform us of any Emergency Department visits, hospitalizations, or changes in symptoms. Call us before going to the ED for breathing or allergy symptoms since we might be able to fit you in for a sick visit. Feel free to contact us anytime with any questions, problems, or concerns.  It was a pleasure to see you again today!   Websites that have reliable patient information: 1. American Academy of Asthma, Allergy, and Immunology: www.aaaai.org 2. Food Allergy Research and Education (FARE): foodallergy.org 3. Mothers of Asthmatics: http://www.asthmacommunitynetwork.org 4. American College of Allergy, Asthma, and Immunology: www.acaai.org  Control of House Dust Mite Allergen  House dust mites play a major role in allergic asthma and rhinitis.  They occur in environments with high humidity wherever human skin, the food for dust mites is found. High levels have been detected in dust obtained from mattresses, pillows, carpets, upholstered furniture, bed covers, clothes and soft toys.  The principal allergen of the house dust mite is found in its feces.  A gram of dust may contain 1,000 mites and 250,000 fecal particles.  Mite antigen is easily measured in the air during house cleaning activities.    1. Encase mattresses, including the box  spring, and pillow, in an air tight cover.  Seal the zipper end of the encased mattresses with wide adhesive tape. 2. Wash the bedding in water of 130 degrees Farenheit weekly.  Avoid cotton comforters/quilts and flannel bedding: the most ideal bed covering is the dacron comforter. 3. Remove all upholstered furniture from the bedroom. 4. Remove carpets, carpet padding, rugs, and non-washable window drapes from the bedroom.  Wash drapes weekly or use plastic window coverings. 5. Remove all non-washable stuffed toys from the bedroom.  Wash stuffed toys weekly. 6. Have the room cleaned frequently with a vacuum cleaner and a damp dust-mop.  The patient should not be in a room which is being cleaned and should wait 1 hour after cleaning before going into the room. 7. Close and seal all heating outlets in the bedroom.  Otherwise, the room will become filled with dust-laden air.  An electric heater can be used to heat the room. 8. Reduce indoor humidity to less than 50%.  Do not use a humidifier.  Reducing Pollen Exposure  The American Academy of Allergy, Asthma and Immunology suggests the following steps to reduce your exposure to pollen during allergy seasons.    1. Do not hang sheets or clothing out to dry; pollen may collect on these items. 2. Do not mow lawns or spend time around freshly cut grass; mowing stirs up pollen. 3. Keep windows closed at night.  Keep car windows closed while driving. 4. Minimize morning activities outdoors, a time when pollen counts are usually at their highest. 5. Stay indoors as much as possible when pollen counts or humidity is high and on windy days  when pollen tends to remain in the air longer. 6. Use air conditioning when possible.  Many air conditioners have filters that trap the pollen spores. 7. Use a HEPA room air filter to remove pollen form the indoor air you breathe.  Control of Dog or Cat Allergen  Avoidance is the best way to manage a dog or cat allergy.  If you have a dog or cat and are allergic to dog or cats, consider removing the dog or cat from the home. If you have a dog or cat but don't want to find it a new home, or if your family wants a pet even though someone in the household is allergic, here are some strategies that may help keep symptoms at bay:  1. Keep the pet out of your bedroom and restrict it to only a few rooms. Be advised that keeping the dog or cat in only one room will not limit the allergens to that room. 2. Don't pet, hug or kiss the dog or cat; if you do, wash your hands with soap and water. 3. High-efficiency particulate air (HEPA) cleaners run continuously in a bedroom or living room can reduce allergen levels over time. 4. Regular use of a high-efficiency vacuum cleaner or a central vacuum can reduce allergen levels. 5. Giving your dog or cat a bath at least once a week can reduce airborne allergen.  Control of Cockroach Allergen  Cockroach allergen has been identified as an important cause of acute attacks of asthma, especially in urban settings.  There are fifty-five species of cockroach that exist in the [place], however only three, the [place], [place] species produce allergen that can affect patients with Asthma.  Allergens can be obtained from fecal particles, egg casings and secretions from cockroaches.    1. Remove food sources. 2. Reduce access to water. 3. Seal access and entry points. 4. Spray runways with 0.5-1% Diazinon or Chlorpyrifos 5. Blow boric acid power under stoves and refrigerator. 6. Place bait stations (hydramethylnon) at feeding sites.  Control of Mold Allergen  Mold and fungi can grow on a variety of surfaces provided certain temperature and moisture conditions exist.  Outdoor molds grow on plants, decaying vegetation and soil.  The major outdoor mold, Alternaria and Cladosporium, are found in very high numbers during hot and dry conditions.  Generally, a late Summer -  Fall peak is seen for common outdoor fungal spores.  Rain will temporarily lower outdoor mold spore count, but counts rise rapidly when the rainy period ends.  The most important indoor molds are Aspergillus and Penicillium.  Dark, humid and poorly ventilated basements are ideal sites for mold growth.  The next most common sites of mold growth are the bathroom and the kitchen.  Outdoor [employer] 1. Use air conditioning and keep windows closed 2. Avoid exposure to decaying vegetation. 3. Avoid leaf raking. 4. Avoid grain handling. 5. Consider wearing a face mask if working in moldy areas.  Indoor Mold Control 1. Maintain humidity below 50%. 2. Clean washable surfaces with 5% bleach solution. 3. Remove sources e.g. contaminated carpets.

## 2016-07-07 NOTE — Progress Notes (Signed)
FOLLOW UP  Date of Service/Encounter:  07/07/16   Assessment:   Recurrent infections  Other chronic rhinitis  Adverse food reaction, subsequent encounter   Plan/Recommendations:   1. Other chronic rhinitis - Testing was positive for grasses, weeds, ragweed, trees, all mold mixes, dust mites, cat, dog, and cockroach - Continue with Qnasl one spray per nostril daily. - Continue with cetirizine 10mg  daily.  - Discussed starting allergy shots, but patient prefers to hold off for now.   2. Adverse food reaction, subsequent encounter - Pineapple testing was negative. - Limit exposure as tolerated.  - There is no need for an EpiPen. - Might be related to oral allergy syndrome, but there are no cross reactivities between pollens and pineapple.  3. Recurrent infections - Hopefully controlling your nasal allergies will help decrease your occurrence of sinus infections. - Will defer immune testing for now.   4. Return in about 1 year (around 07/07/2017).    Subjective:   Angelica Carroll is a 40 y.o. female presenting today for follow up of  Chief Complaint  Patient presents with  . Allergy Testing  .  Angelica Carroll has a history of the following: Patient Active Problem List   Diagnosis Date Noted  . Acute sinusitis 11/17/2015  . Cough 11/17/2015  . Diabetes (HCC) 07/16/2014    History obtained from: chart review and patient.  Angelica Carroll was referred by Jarrett Soho, PA-C.     Angelica Carroll is a 40 y.o. female presenting for a follow up visit for allergies. She was last seen around one month ago for sinus infection. Because she had had 3 sinus infections in 1 year, We felt that ruling out seasonal allergies was warranted. She has never had allergy testing performed since moving to [place].   Angelica Carroll did live in [place] for several years but moved to [place] 5 years ago. When she was in [place], she felt that she was sick all of the time. She had constant nasal  congestion and sinus pressure. Once she moved to [place], her symptoms were much better controlled. However over the last calendar year, she has had worsening nasal congestion with sneezing and itchy watery eyes. She has had a total of 3 sinus infections in the past year. Typically she is treated with azithromycin which does not seem to help. She did start Qnasl approximately 1 month ago and she takes cetirizine 10 mg every day. She can tell when she does not take her cetirizine. Prior to this, she was on Rhinocort which did not work well. Her symptoms are consistent throughout the entire year. She does get hives with cats and has worsening congestion and rhinorrhea with ocular itching when she is around cats.  The patient does have a history of diabetes mellitus controlled the metformin as well as hypertension. Her hemoglobin A1c has been slowly decreasing. She is on metformin alone. She lives at home with her husband. Her husband works as a Naval architect. She also has a 27-year-old stepson who does spend time with the few times a week. Otherwise, there have been no changes to the past medical history, surgical history, family history, or social history.   Otherwise, there have been no changes to the past medical history, surgical history, family history, or social history.     Review of Systems: a 14-point review of systems is pertinent for what is mentioned in HPI.  Otherwise, all other systems were negative. Constitutional: negative other than that listed in the  HPI Eyes: negative other than that listed in the HPI Ears, nose, mouth, throat, and face: negative other than that listed in the HPI Respiratory: negative other than that listed in the HPI Cardiovascular: negative other than that listed in the HPI Gastrointestinal: negative other than that listed in the HPI Genitourinary: negative other than that listed in the HPI Integument: negative other than that listed in the HPI Hematologic:  negative other than that listed in the HPI Musculoskeletal: negative other than that listed in the HPI Neurological: negative other than that listed in the HPI Allergy/Immunologic: negative other than that listed in the HPI    Objective:   Blood pressure 130/78, pulse 84, resp. rate 16. There is no height or weight on file to calculate BMI.   Physical Exam:  General: Alert, interactive, in no acute distress. Very pleasant female. Talkative and laughing. HEENT: TMs pearly gray, turbinates pale and boggy  with clear discharge, post-pharynx erythematous. Neck: Supple without thyromegaly. Lungs: Clear to auscultation without wheezing, rhonchi or rales. No increased work of breathing. CV: Normal S1, S2 without murmurs. Capillary refill <2 seconds.  Abdomen: Nondistended, nontender. No guarding or rebound tenderness. Skin: Warm and dry, without lesions or rashes. Extremities:  No clubbing, cyanosis or edema. Neuro:   Grossly intact. No focal deficits.   Diagnostic studies:   Allergy Studies:   Indoor/Outdoor Environmental Percutaneous Panel: positive for cedar and dust mite with adequate controls  Indoor/Outdoor Environmental Intradermal Panel: positive for grass mix, ragweed mix, weed mix, all mold mixes, tree mix, cat, dog, and cockroach with adequate controls  Pineapple percutaneous testing: negative with adequate controls   Malachi BondsJoel Carolin Quang, MD FAAAAI [facility]

## 2016-07-12 MED FILL — LYRICA 75 MG CAPSULE: 75 | 30 days supply | Qty: 60 | Fill #1

## 2016-08-06 MED FILL — metFORMIN HCL 1000 MG TABS: 1000 | 90 days supply | Qty: 180 | Fill #0

## 2016-08-13 MED FILL — LYRICA 75 MG CAPSULE: 75 | 30 days supply | Qty: 60 | Fill #0

## 2016-09-01 ENCOUNTER — Encounter: Payer: 59 | Attending: Family Medicine | Admitting: Skilled Nursing Facility1

## 2016-09-01 DIAGNOSIS — Z713 Dietary counseling and surveillance: Secondary | ICD-10-CM | POA: Diagnosis not present

## 2016-09-02 ENCOUNTER — Encounter: Payer: Self-pay | Admitting: Skilled Nursing Facility1

## 2016-09-02 MED FILL — AMLODIPINE BESYLATE 10 MG T: 10 | 90 days supply | Qty: 90 | Fill #0

## 2016-09-02 NOTE — Progress Notes (Signed)
Patient was seen on 09/01/2016 for the Weight Loss Class at the [facility]. The following learning objectives were met by the patient during this class:   Describe healthy choices in each food group  Describe portion size of foods  Use plate method for meal planning  Demonstrate how to read Nutrition Facts food label  Set realistic goals for weight loss, diet changes, and physical activity.   Goals:  1. Make healthy food choices in each food group.  2. Reduce portion size of foods.  3. Increase fruit and vegetable intake.  4. Use plate method for meal planning.  5. Increase physical activity.    Handouts given:   1. Nutrition Strategies for Weight Loss   2. Meal plan/portion card   3. MyPlate Planner   4. Weight Management Recipe Resources   5. Bake, Broil, Grill   

## 2016-09-08 ENCOUNTER — Other Ambulatory Visit: Payer: Self-pay | Admitting: Obstetrics & Gynecology

## 2016-09-08 DIAGNOSIS — N63 Unspecified lump in unspecified breast: Secondary | ICD-10-CM

## 2016-09-08 MED FILL — CYCLOBENZAPRINE 10 MG TAB: 10 | 30 days supply | Qty: 90 | Fill #0

## 2016-09-09 DIAGNOSIS — H52222 Regular astigmatism, left eye: Secondary | ICD-10-CM | POA: Diagnosis not present

## 2016-09-09 MED FILL — HYDROCODON-APAP 5-325: 5-325 | 15 days supply | Qty: 30 | Fill #0

## 2016-09-10 MED FILL — LYRICA 75 MG CAPSULE: 75 | 30 days supply | Qty: 60 | Fill #1

## 2016-09-17 MED FILL — PANTOPRAZOLE SOD DR 40 MG T: 40 | 90 days supply | Qty: 90 | Fill #1

## 2016-09-17 MED FILL — ATORVASTATIN 10 MG TABLET: 10 | 90 days supply | Qty: 90 | Fill #0

## 2016-09-17 MED FILL — LARIN 1.5 MG-30 MCG TABLET: 1.5-30 | 28 days supply | Qty: 21 | Fill #1

## 2016-09-20 ENCOUNTER — Other Ambulatory Visit: Payer: Self-pay | Admitting: Obstetrics & Gynecology

## 2016-09-20 ENCOUNTER — Ambulatory Visit
Admission: RE | Admit: 2016-09-20 | Discharge: 2016-09-20 | Disposition: A | Discharge status: Home / Self Care | Payer: 59 | Source: Ambulatory Visit | Attending: Obstetrics & Gynecology | Admitting: Obstetrics & Gynecology

## 2016-09-20 ENCOUNTER — Other Ambulatory Visit (HOSPITAL_COMMUNITY)
Admission: RE | Admit: 2016-09-20 | Discharge: 2016-09-20 | Disposition: A | Discharge status: Home / Self Care | Payer: 59 | Source: Ambulatory Visit | Attending: Obstetrics and Gynecology | Admitting: Obstetrics and Gynecology

## 2016-09-20 DIAGNOSIS — Z01411 Encounter for gynecological examination (general) (routine) with abnormal findings: Secondary | ICD-10-CM | POA: Diagnosis not present

## 2016-09-20 DIAGNOSIS — Z3041 Encounter for surveillance of contraceptive pills: Secondary | ICD-10-CM | POA: Diagnosis not present

## 2016-09-20 DIAGNOSIS — Z6837 Body mass index (BMI) 37.0-37.9, adult: Secondary | ICD-10-CM | POA: Diagnosis not present

## 2016-09-20 DIAGNOSIS — N6321 Unspecified lump in the left breast, upper outer quadrant: Secondary | ICD-10-CM | POA: Diagnosis not present

## 2016-09-20 DIAGNOSIS — E669 Obesity, unspecified: Secondary | ICD-10-CM | POA: Diagnosis not present

## 2016-09-20 DIAGNOSIS — Z01419 Encounter for gynecological examination (general) (routine) without abnormal findings: Secondary | ICD-10-CM | POA: Insufficient documentation

## 2016-09-20 DIAGNOSIS — N946 Dysmenorrhea, unspecified: Secondary | ICD-10-CM | POA: Diagnosis not present

## 2016-09-20 DIAGNOSIS — N63 Unspecified lump in unspecified breast: Secondary | ICD-10-CM

## 2016-09-20 DIAGNOSIS — L659 Nonscarring hair loss, unspecified: Secondary | ICD-10-CM | POA: Diagnosis not present

## 2016-09-22 LAB — CYTOLOGY - PAP: Diagnosis: NEGATIVE

## 2016-09-29 MED FILL — TRIAMTERENE-HCTZ 37.5-25 MG: 37.5-25 | 90 days supply | Qty: 90 | Fill #0 | Status: TO

## 2016-10-01 DIAGNOSIS — E119 Type 2 diabetes mellitus without complications: Secondary | ICD-10-CM | POA: Diagnosis not present

## 2016-10-01 DIAGNOSIS — K219 Gastro-esophageal reflux disease without esophagitis: Secondary | ICD-10-CM | POA: Diagnosis not present

## 2016-10-01 DIAGNOSIS — L659 Nonscarring hair loss, unspecified: Secondary | ICD-10-CM | POA: Diagnosis not present

## 2016-10-01 DIAGNOSIS — Z309 Encounter for contraceptive management, unspecified: Secondary | ICD-10-CM | POA: Diagnosis not present

## 2016-10-01 DIAGNOSIS — I10 Essential (primary) hypertension: Secondary | ICD-10-CM | POA: Diagnosis not present

## 2016-10-01 DIAGNOSIS — E559 Vitamin D deficiency, unspecified: Secondary | ICD-10-CM | POA: Diagnosis not present

## 2016-10-01 DIAGNOSIS — E785 Hyperlipidemia, unspecified: Secondary | ICD-10-CM | POA: Diagnosis not present

## 2016-10-01 DIAGNOSIS — Z862 Personal history of diseases of the blood and blood-forming organs and certain disorders involving the immune mechanism: Secondary | ICD-10-CM | POA: Diagnosis not present

## 2016-10-01 DIAGNOSIS — Z7984 Long term (current) use of oral hypoglycemic drugs: Secondary | ICD-10-CM | POA: Diagnosis not present

## 2016-10-01 MED FILL — LEVOTHYROXINE 125 MCG TABLE: 125 | 30 days supply | Qty: 30 | Fill #0 | Status: TO

## 2016-10-07 MED FILL — LARIN 1.5 MG-30 MCG TABLET: 1.5-30 | 28 days supply | Qty: 21 | Fill #0 | Status: TO

## 2016-10-27 MED FILL — LYRICA 75 MG CAPSULE: 75 | 30 days supply | Qty: 60 | Fill #0

## 2016-11-26 MED FILL — CYCLOBENZAPRINE 10 MG TAB: 10 | 30 days supply | Qty: 90 | Fill #0

## 2016-11-26 MED FILL — LYRICA 75 MG CAPSULE: 75 | 30 days supply | Qty: 60 | Fill #0 | Status: TO

## 2016-12-24 MED FILL — PANTOPRAZOLE SOD DR 40 MG T: 40 | 90 days supply | Qty: 90 | Fill #0 | Status: TO

## 2017-04-29 IMAGING — MR MR LUMBAR SPINE WO/W CM
4 of 7 series · 18 of 48 positions shown · IV contrast (multihance)
Comparison: Thoracic spine MRI 03/03/2016. Lumbar CT myelogram
09/24/2014.

CLINICAL DATA: 39-year-old female status post work related injury
in 9599 with lumbar back pain, right side pain. Thoracic spine MRI
[REDACTED] revealing a nonspecific L2 vertebral body lesion. Subsequent
encounter.

EXAM:
MRI LUMBAR SPINE WITHOUT AND WITH CONTRAST
TECHNIQUE: Multiplanar and multiecho pulse sequences of the lumbar spine were
obtained without and with intravenous contrast.
CONTRAST:  20mL MULTIHANCE GADOBENATE DIMEGLUMINE 529 MG/ML IV SOLN

[Series 2: T2 · sagittal · 4.0mm · 0.55mm/px · 3 of 13 slices shown (1 of 2)]
[im 1/13]
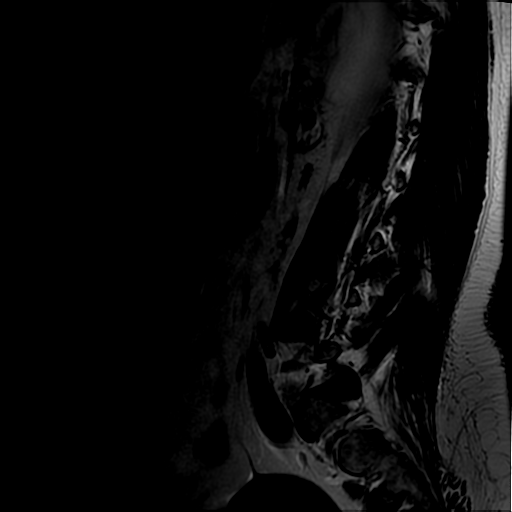
[im 7/13]
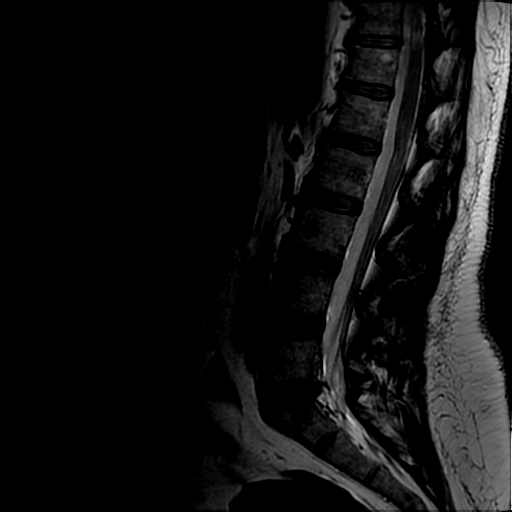
[im 13/13]
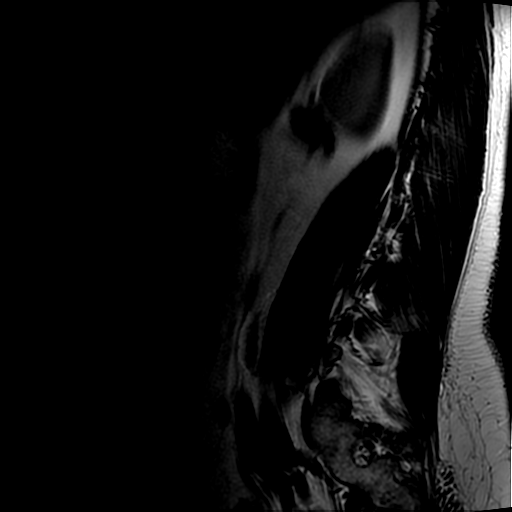

[Series 4: T1 · sagittal · 4.0mm · 0.55mm/px · 3 of 13 slices shown (1 of 2)]
[im 1/13]
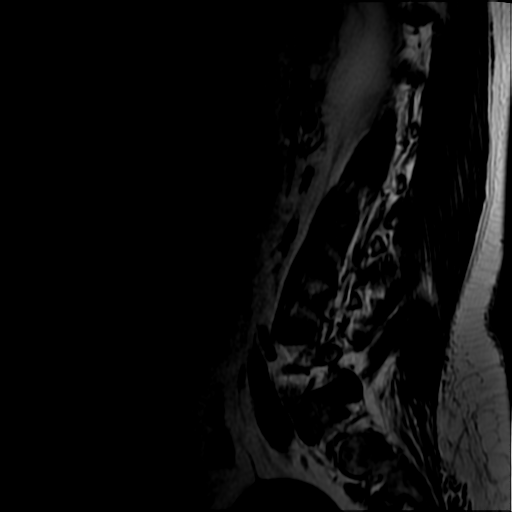
[im 9/13]
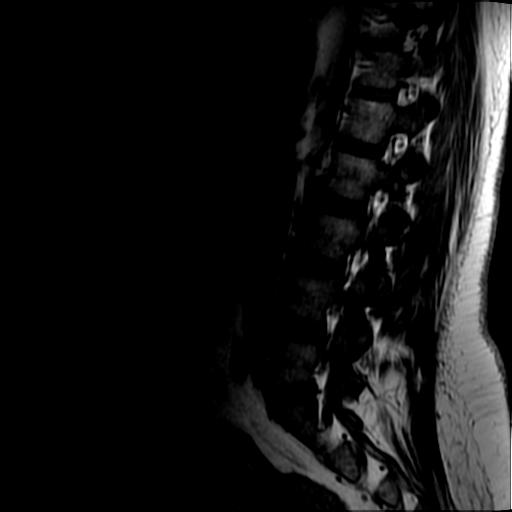
[im 13/13]
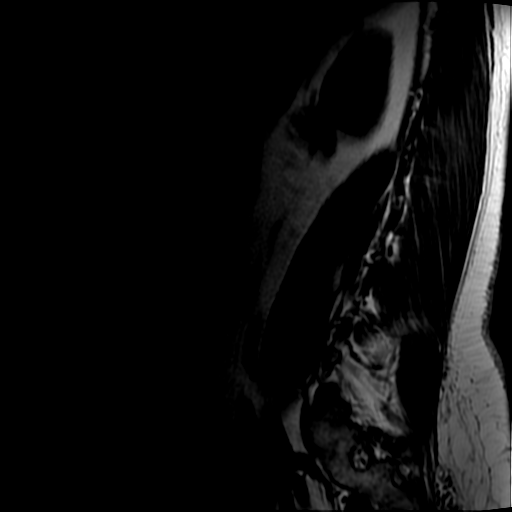

[Series 5: T2 · axial · 4.0mm · 0.39mm/px · z∈[-118,+51]mm · 9 of 34 slices shown (2 of 2)]
[im 1/34]
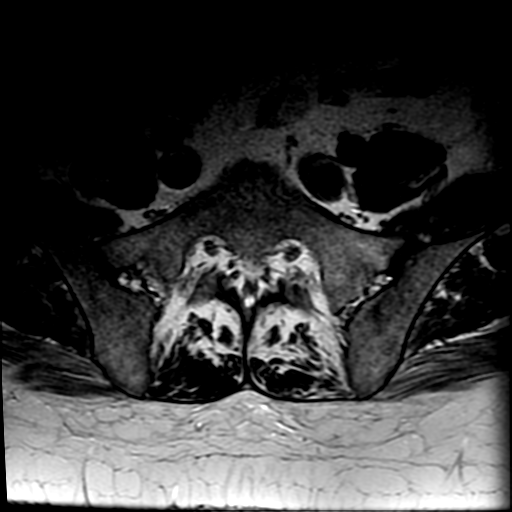
[im 4/34]
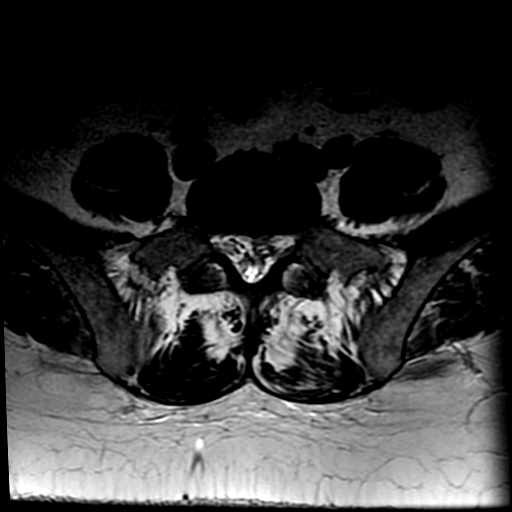
[im 7/34]
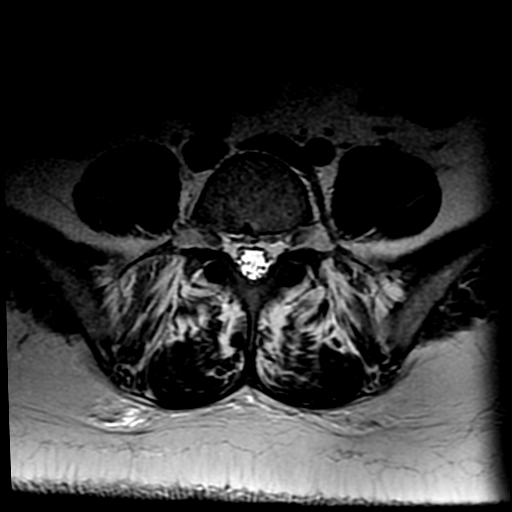
[im 10/34]
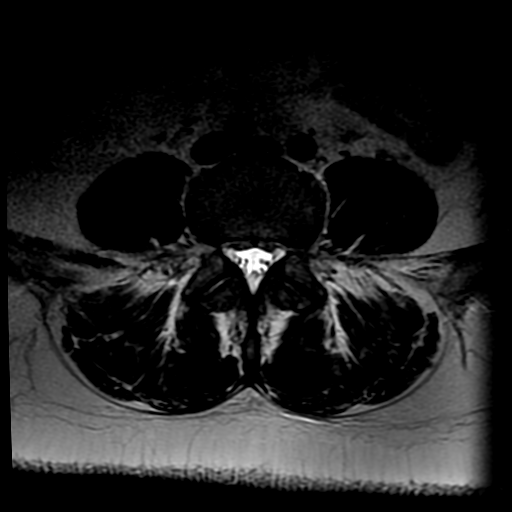
[im 14/34]
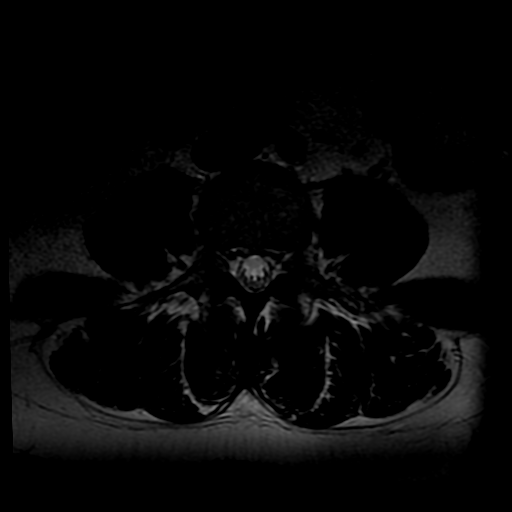
[im 17/34]
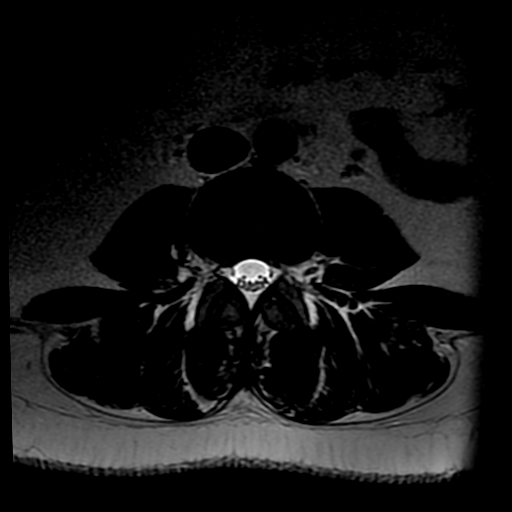
[im 20/34]
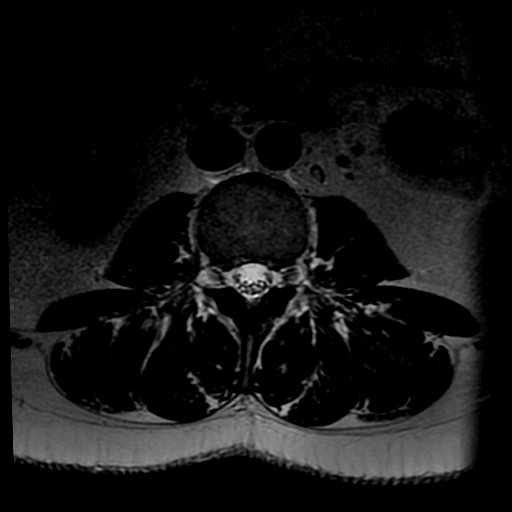
[im 24/34]
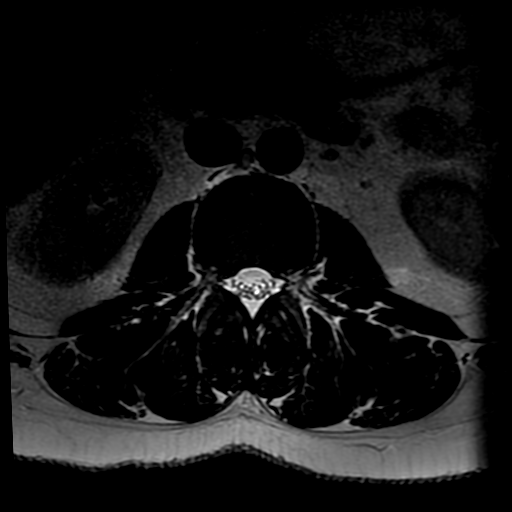
[im 30/34]
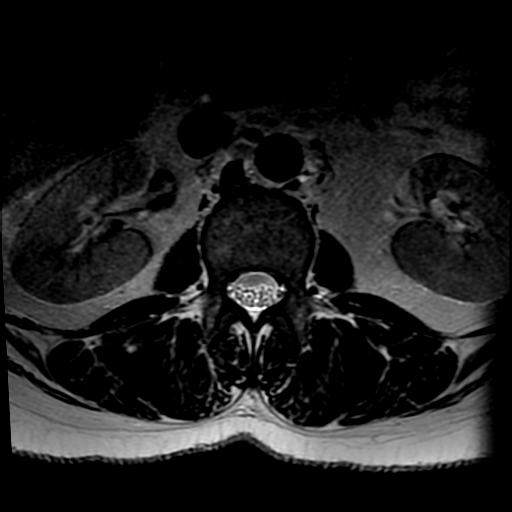

[Series 6: T1 · axial · 4.0mm · 0.39mm/px · z∈[-103,+51]mm · 3 of 34 slices shown (2 of 2)]
[im 4/34]
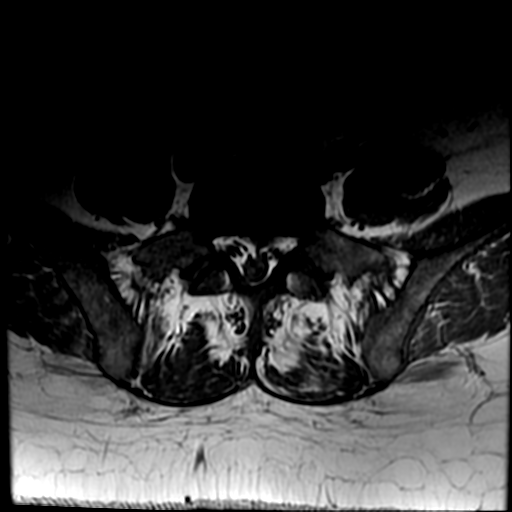
[im 17/34]
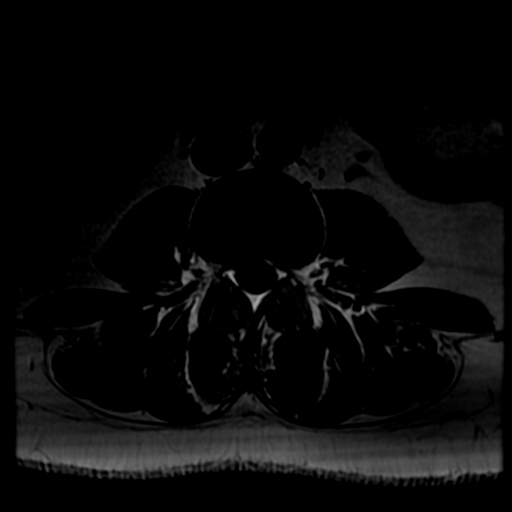
[im 30/34]
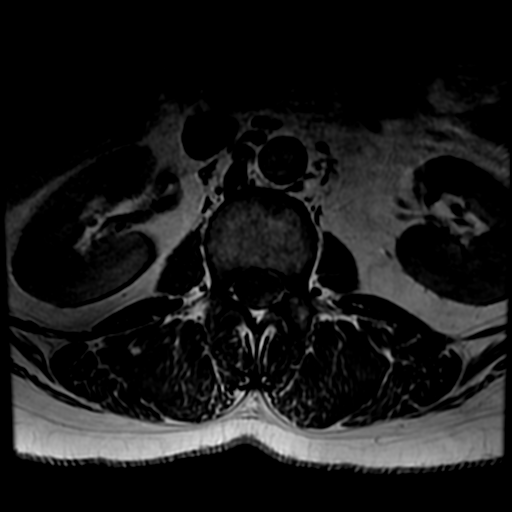

[18 of 48 positions shown; findings below may reference images not displayed]

FINDINGS: Segmentation: Normal common this is concordant with the thoracic
spine numbering [REDACTED].

Alignment: Stable since 9599. Mild straightening of lordosis,
minimal to mild retrolisthesis at L5-S1.

Vertebrae: A 10 mm area of decreased T1 and T2 signal in the right
central aspect of the L2 vertebral body is re- demonstrated and
stable [REDACTED]. There is minimal if any associated enhancement
(series 8, image 5) and no convincing marrow edema. On the 9599 CT
myelogram there is a subtle speckled appearance of the bone
corresponding to this area (sagittal image 28 of that study) There
is a smaller similar but more subtle lesion in the anterior L1
vertebral body which does demonstrate mild circumscribed oval
enhancement as seen on series 8, image 9. And this is stable on STIR
imaging. Bone marrow signal elsewhere in the visible spine including
the sacrum is within normal limits. No destructive osseous lesion.

Conus medullaris: Extends to the L1 level and appears normal. No
abnormal intradural enhancement.

Paraspinal and other soft tissues: Negative.

Disc levels:

Normal lumbar intervertebral discs down to L3-L4. Mild facet
hypertrophy at that level without stenosis.

L4-L5: Minimal disc desiccation and disc bulge. Mild to moderate
facet hypertrophy. No stenosis.

L5-S1: Disc desiccation. Mild chronic appearing posterior disc
bulging with no mass effect on the thecal sac. Mild facet
hypertrophy. No stenosis.
IMPRESSION: 1. Stable L2 vertebral body lesion [REDACTED]. This and the small L1
vertebral body lesion are both favored to be benign vertebral body
hemangiomas with atypical MRI signal. Repeat noncontrast study could
be performed in 6-12 months to document stability.
2. No acute findings. Mild lumbar spine degeneration at L4-L5 and
L5-S1 with no spinal stenosis or convincing neural impingement.

## 2021-08-20 ENCOUNTER — Other Ambulatory Visit (HOSPITAL_BASED_OUTPATIENT_CLINIC_OR_DEPARTMENT_OTHER): Payer: Self-pay
